# Patient Record
Sex: Male | Born: 1940 | Race: White | Hispanic: No
Health system: Southern US, Community
[De-identification: ages and names within clinical notes are randomized; demographics above are authoritative.]

## PROBLEM LIST (undated history)

## (undated) DIAGNOSIS — I10 Essential (primary) hypertension: Secondary | ICD-10-CM

## (undated) DIAGNOSIS — G610 Guillain-Barre syndrome: Secondary | ICD-10-CM

## (undated) DIAGNOSIS — F419 Anxiety disorder, unspecified: Secondary | ICD-10-CM

## (undated) DIAGNOSIS — Z9911 Dependence on respirator [ventilator] status: Secondary | ICD-10-CM

## (undated) DIAGNOSIS — F319 Bipolar disorder, unspecified: Secondary | ICD-10-CM

## (undated) DIAGNOSIS — I4891 Unspecified atrial fibrillation: Secondary | ICD-10-CM

## (undated) DIAGNOSIS — R532 Functional quadriplegia: Secondary | ICD-10-CM

---

## 2014-11-19 ENCOUNTER — Inpatient Hospital Stay (HOSPITAL_COMMUNITY): Payer: Medicare Other

## 2014-11-19 ENCOUNTER — Emergency Department (HOSPITAL_COMMUNITY): Payer: Medicare Other

## 2014-11-19 ENCOUNTER — Inpatient Hospital Stay (HOSPITAL_COMMUNITY)
Admission: EM | Admit: 2014-11-19 | Discharge: 2014-11-23 | DRG: 871 | Disposition: A | Discharge status: Other Acute Inpatient Hospital | Payer: Medicare Other | Attending: Pulmonary Disease | Admitting: Pulmonary Disease

## 2014-11-19 ENCOUNTER — Encounter (HOSPITAL_COMMUNITY): Payer: Self-pay

## 2014-11-19 DIAGNOSIS — K529 Noninfective gastroenteritis and colitis, unspecified: Secondary | ICD-10-CM | POA: Diagnosis present

## 2014-11-19 DIAGNOSIS — Z930 Tracheostomy status: Secondary | ICD-10-CM | POA: Diagnosis not present

## 2014-11-19 DIAGNOSIS — J9611 Chronic respiratory failure with hypoxia: Secondary | ICD-10-CM

## 2014-11-19 DIAGNOSIS — Z931 Gastrostomy status: Secondary | ICD-10-CM

## 2014-11-19 DIAGNOSIS — F319 Bipolar disorder, unspecified: Secondary | ICD-10-CM | POA: Diagnosis present

## 2014-11-19 DIAGNOSIS — R609 Edema, unspecified: Secondary | ICD-10-CM | POA: Diagnosis not present

## 2014-11-19 DIAGNOSIS — D649 Anemia, unspecified: Secondary | ICD-10-CM | POA: Diagnosis present

## 2014-11-19 DIAGNOSIS — N179 Acute kidney failure, unspecified: Secondary | ICD-10-CM

## 2014-11-19 DIAGNOSIS — R532 Functional quadriplegia: Secondary | ICD-10-CM | POA: Diagnosis present

## 2014-11-19 DIAGNOSIS — L89150 Pressure ulcer of sacral region, unstageable: Secondary | ICD-10-CM | POA: Diagnosis not present

## 2014-11-19 DIAGNOSIS — K567 Ileus, unspecified: Secondary | ICD-10-CM

## 2014-11-19 DIAGNOSIS — I4891 Unspecified atrial fibrillation: Secondary | ICD-10-CM | POA: Diagnosis present

## 2014-11-19 DIAGNOSIS — Z885 Allergy status to narcotic agent status: Secondary | ICD-10-CM

## 2014-11-19 DIAGNOSIS — I10 Essential (primary) hypertension: Secondary | ICD-10-CM | POA: Diagnosis not present

## 2014-11-19 DIAGNOSIS — E872 Acidosis: Secondary | ICD-10-CM | POA: Diagnosis present

## 2014-11-19 DIAGNOSIS — Z9911 Dependence on respirator [ventilator] status: Secondary | ICD-10-CM | POA: Diagnosis not present

## 2014-11-19 DIAGNOSIS — R6521 Severe sepsis with septic shock: Secondary | ICD-10-CM | POA: Diagnosis present

## 2014-11-19 DIAGNOSIS — B964 Proteus (mirabilis) (morganii) as the cause of diseases classified elsewhere: Secondary | ICD-10-CM

## 2014-11-19 DIAGNOSIS — N390 Urinary tract infection, site not specified: Secondary | ICD-10-CM | POA: Diagnosis present

## 2014-11-19 DIAGNOSIS — Z452 Encounter for adjustment and management of vascular access device: Secondary | ICD-10-CM

## 2014-11-19 DIAGNOSIS — E860 Dehydration: Secondary | ICD-10-CM | POA: Diagnosis not present

## 2014-11-19 DIAGNOSIS — J9610 Chronic respiratory failure, unspecified whether with hypoxia or hypercapnia: Secondary | ICD-10-CM

## 2014-11-19 DIAGNOSIS — E440 Moderate protein-calorie malnutrition: Secondary | ICD-10-CM | POA: Diagnosis not present

## 2014-11-19 DIAGNOSIS — K5660 Unspecified intestinal obstruction: Secondary | ICD-10-CM | POA: Diagnosis present

## 2014-11-19 DIAGNOSIS — Z7901 Long term (current) use of anticoagulants: Secondary | ICD-10-CM | POA: Diagnosis not present

## 2014-11-19 DIAGNOSIS — F419 Anxiety disorder, unspecified: Secondary | ICD-10-CM | POA: Diagnosis present

## 2014-11-19 DIAGNOSIS — J150 Pneumonia due to Klebsiella pneumoniae: Secondary | ICD-10-CM | POA: Diagnosis present

## 2014-11-19 DIAGNOSIS — A419 Sepsis, unspecified organism: Secondary | ICD-10-CM | POA: Diagnosis present

## 2014-11-19 DIAGNOSIS — Z6832 Body mass index (BMI) 32.0-32.9, adult: Secondary | ICD-10-CM

## 2014-11-19 DIAGNOSIS — K219 Gastro-esophageal reflux disease without esophagitis: Secondary | ICD-10-CM | POA: Diagnosis not present

## 2014-11-19 DIAGNOSIS — L8990 Pressure ulcer of unspecified site, unspecified stage: Secondary | ICD-10-CM | POA: Insufficient documentation

## 2014-11-19 DIAGNOSIS — E876 Hypokalemia: Secondary | ICD-10-CM | POA: Diagnosis present

## 2014-11-19 HISTORY — DX: Bipolar disorder, unspecified: F31.9

## 2014-11-19 HISTORY — DX: Unspecified atrial fibrillation: I48.91

## 2014-11-19 HISTORY — DX: Functional quadriplegia: R53.2

## 2014-11-19 HISTORY — DX: Anxiety disorder, unspecified: F41.9

## 2014-11-19 HISTORY — DX: Essential (primary) hypertension: I10

## 2014-11-19 HISTORY — DX: Dependence on respirator (ventilator) status: Z99.11

## 2014-11-19 HISTORY — DX: Guillain-Barre syndrome: G61.0

## 2014-11-19 LAB — GLUCOSE, CAPILLARY
GLUCOSE-CAPILLARY: 108 mg/dL — AB (ref 65–99)
GLUCOSE-CAPILLARY: 116 mg/dL — AB (ref 65–99)
Glucose-Capillary: 125 mg/dL — ABNORMAL HIGH (ref 65–99)

## 2014-11-19 LAB — CBC WITH DIFFERENTIAL/PLATELET
BASOS PCT: 0 % (ref 0–1)
Basophils Absolute: 0 10*3/uL (ref 0.0–0.1)
EOS PCT: 0 % (ref 0–5)
Eosinophils Absolute: 0 10*3/uL (ref 0.0–0.7)
HCT: 26.6 % — ABNORMAL LOW (ref 39.0–52.0)
Hemoglobin: 9 g/dL — ABNORMAL LOW (ref 13.0–17.0)
Lymphocytes Relative: 3 % — ABNORMAL LOW (ref 12–46)
Lymphs Abs: 1 10*3/uL (ref 0.7–4.0)
MCH: 30.1 pg (ref 26.0–34.0)
MCHC: 33.8 g/dL (ref 30.0–36.0)
MCV: 89 fL (ref 78.0–100.0)
MONO ABS: 1.4 10*3/uL — AB (ref 0.1–1.0)
MONOS PCT: 4 % (ref 3–12)
NEUTROS ABS: 31.8 10*3/uL — AB (ref 1.7–7.7)
NEUTROS PCT: 93 % — AB (ref 43–77)
Platelets: 593 10*3/uL — ABNORMAL HIGH (ref 150–400)
RBC: 2.99 MIL/uL — AB (ref 4.22–5.81)
RDW: 16.3 % — AB (ref 11.5–15.5)
WBC: 34.2 10*3/uL — ABNORMAL HIGH (ref 4.0–10.5)

## 2014-11-19 LAB — URINALYSIS, ROUTINE W REFLEX MICROSCOPIC
Glucose, UA: NEGATIVE mg/dL
Ketones, ur: NEGATIVE mg/dL
NITRITE: NEGATIVE
Protein, ur: 100 mg/dL — AB
Specific Gravity, Urine: 1.02 (ref 1.005–1.030)
Urobilinogen, UA: 0.2 mg/dL (ref 0.0–1.0)
pH: 8.5 — ABNORMAL HIGH (ref 5.0–8.0)

## 2014-11-19 LAB — COMPREHENSIVE METABOLIC PANEL
ALT: 25 U/L (ref 17–63)
AST: 39 U/L (ref 15–41)
Albumin: 2 g/dL — ABNORMAL LOW (ref 3.5–5.0)
Alkaline Phosphatase: 174 U/L — ABNORMAL HIGH (ref 38–126)
Anion gap: 16 — ABNORMAL HIGH (ref 5–15)
BILIRUBIN TOTAL: 0.9 mg/dL (ref 0.3–1.2)
BUN: 55 mg/dL — ABNORMAL HIGH (ref 6–20)
CHLORIDE: 89 mmol/L — AB (ref 101–111)
CO2: 26 mmol/L (ref 22–32)
Calcium: 9.2 mg/dL (ref 8.9–10.3)
Creatinine, Ser: 1.85 mg/dL — ABNORMAL HIGH (ref 0.61–1.24)
GFR, EST AFRICAN AMERICAN: 40 mL/min — AB (ref >60)
GFR, EST NON AFRICAN AMERICAN: 34 mL/min — AB (ref >60)
Glucose, Bld: 121 mg/dL — ABNORMAL HIGH (ref 65–99)
POTASSIUM: 4.7 mmol/L (ref 3.5–5.1)
Sodium: 131 mmol/L — ABNORMAL LOW (ref 135–145)
TOTAL PROTEIN: 6 g/dL — AB (ref 6.5–8.1)

## 2014-11-19 LAB — I-STAT CG4 LACTIC ACID, ED: LACTIC ACID, VENOUS: 2.87 mmol/L — AB (ref 0.5–2.0)

## 2014-11-19 LAB — I-STAT ARTERIAL BLOOD GAS, ED
Acid-Base Excess: 2 mmol/L (ref 0.0–2.0)
Bicarbonate: 26.1 mEq/L — ABNORMAL HIGH (ref 20.0–24.0)
O2 Saturation: 100 %
PO2 ART: 170 mmHg — AB (ref 80.0–100.0)
TCO2: 27 mmol/L (ref 0–100)
pCO2 arterial: 37.8 mmHg (ref 35.0–45.0)
pH, Arterial: 7.447 (ref 7.350–7.450)

## 2014-11-19 LAB — URINE MICROSCOPIC-ADD ON

## 2014-11-19 LAB — CARBOXYHEMOGLOBIN
Carboxyhemoglobin: 1.2 % (ref 0.5–1.5)
METHEMOGLOBIN: 0.9 % (ref 0.0–1.5)
O2 Saturation: 68.7 %
Total hemoglobin: 10.2 g/dL — ABNORMAL LOW (ref 13.5–18.0)

## 2014-11-19 LAB — PROCALCITONIN: PROCALCITONIN: 41.28 ng/mL

## 2014-11-19 LAB — PROTIME-INR
INR: 3.46 — ABNORMAL HIGH (ref 0.00–1.49)
Prothrombin Time: 34.1 seconds — ABNORMAL HIGH (ref 11.6–15.2)

## 2014-11-19 LAB — MRSA PCR SCREENING: MRSA by PCR: NEGATIVE

## 2014-11-19 LAB — I-STAT CHEM 8, ED
BUN: 47 mg/dL — ABNORMAL HIGH (ref 6–20)
CHLORIDE: 94 mmol/L — AB (ref 101–111)
Calcium, Ion: 1.05 mmol/L — ABNORMAL LOW (ref 1.13–1.30)
Creatinine, Ser: 2 mg/dL — ABNORMAL HIGH (ref 0.61–1.24)
GLUCOSE: 126 mg/dL — AB (ref 65–99)
HCT: 28 % — ABNORMAL LOW (ref 39.0–52.0)
HEMOGLOBIN: 9.5 g/dL — AB (ref 13.0–17.0)
Potassium: 3.8 mmol/L (ref 3.5–5.1)
Sodium: 131 mmol/L — ABNORMAL LOW (ref 135–145)
TCO2: 22 mmol/L (ref 0–100)

## 2014-11-19 LAB — CORTISOL: CORTISOL PLASMA: 65 ug/dL

## 2014-11-19 LAB — PHOSPHORUS: PHOSPHORUS: 5.1 mg/dL — AB (ref 2.5–4.6)

## 2014-11-19 LAB — APTT: aPTT: 51 seconds — ABNORMAL HIGH (ref 24–37)

## 2014-11-19 LAB — LIPASE, BLOOD: LIPASE: 11 U/L — AB (ref 22–51)

## 2014-11-19 LAB — MAGNESIUM: MAGNESIUM: 2 mg/dL (ref 1.7–2.4)

## 2014-11-19 LAB — CG4 I-STAT (LACTIC ACID): Lactic Acid, Venous: 1.67 mmol/L (ref 0.5–2.0)

## 2014-11-19 MED ORDER — SODIUM CHLORIDE 0.9 % IV BOLUS (SEPSIS)
1000.0000 mL | INTRAVENOUS | Status: AC
  Administered 2014-11-19 (×2): 1000 mL via INTRAVENOUS

## 2014-11-19 MED ORDER — VANCOMYCIN HCL 10 G IV SOLR
2000.0000 mg | Freq: Once | INTRAVENOUS | Status: AC
  Administered 2014-11-19: 2000 mg via INTRAVENOUS
  Filled 2014-11-19: qty 2000

## 2014-11-19 MED ORDER — FAMOTIDINE IN NACL 20-0.9 MG/50ML-% IV SOLN
20.0000 mg | Freq: Two times a day (BID) | INTRAVENOUS | Status: DC
  Administered 2014-11-19 – 2014-11-23 (×8): 20 mg via INTRAVENOUS
  Filled 2014-11-19 (×10): qty 50

## 2014-11-19 MED ORDER — METRONIDAZOLE IN NACL 5-0.79 MG/ML-% IV SOLN
500.0000 mg | Freq: Four times a day (QID) | INTRAVENOUS | Status: DC
  Administered 2014-11-19 – 2014-11-20 (×3): 500 mg via INTRAVENOUS
  Filled 2014-11-19 (×7): qty 100

## 2014-11-19 MED ORDER — INSULIN ASPART 100 UNIT/ML ~~LOC~~ SOLN
1.0000 [IU] | SUBCUTANEOUS | Status: DC
  Administered 2014-11-19 – 2014-11-20 (×2): 1 [IU] via SUBCUTANEOUS

## 2014-11-19 MED ORDER — CETYLPYRIDINIUM CHLORIDE 0.05 % MT LIQD
7.0000 mL | Freq: Two times a day (BID) | OROMUCOSAL | Status: DC
  Administered 2014-11-20 (×2): 7 mL via OROMUCOSAL

## 2014-11-19 MED ORDER — PHENYLEPHRINE HCL 10 MG/ML IJ SOLN
20.0000 ug/min | INTRAMUSCULAR | Status: DC
  Administered 2014-11-19: 20 ug/min via INTRAVENOUS
  Filled 2014-11-19: qty 1

## 2014-11-19 MED ORDER — FENTANYL CITRATE (PF) 100 MCG/2ML IJ SOLN
50.0000 ug | INTRAMUSCULAR | Status: DC | PRN
  Administered 2014-11-20 – 2014-11-21 (×3): 50 ug via INTRAVENOUS
  Filled 2014-11-19 (×3): qty 2

## 2014-11-19 MED ORDER — SODIUM CHLORIDE 0.9 % IV BOLUS (SEPSIS)
500.0000 mL | INTRAVENOUS | Status: AC
  Administered 2014-11-19: 500 mL via INTRAVENOUS

## 2014-11-19 MED ORDER — PHENYLEPHRINE HCL 10 MG/ML IJ SOLN
30.0000 ug/min | INTRAVENOUS | Status: DC
  Filled 2014-11-19: qty 1

## 2014-11-19 MED ORDER — ACETAMINOPHEN 650 MG RE SUPP
650.0000 mg | Freq: Once | RECTAL | Status: AC
  Administered 2014-11-19: 650 mg via RECTAL
  Filled 2014-11-19: qty 1

## 2014-11-19 MED ORDER — FENTANYL CITRATE (PF) 100 MCG/2ML IJ SOLN: 50.0000 ug | INTRAMUSCULAR | Status: DC | PRN

## 2014-11-19 MED ORDER — VANCOMYCIN 50 MG/ML ORAL SOLUTION
500.0000 mg | Freq: Four times a day (QID) | ORAL | Status: DC
  Filled 2014-11-19 (×4): qty 10

## 2014-11-19 MED ORDER — PIPERACILLIN-TAZOBACTAM 3.375 G IVPB 30 MIN
3.3750 g | Freq: Once | INTRAVENOUS | Status: AC
  Administered 2014-11-19: 3.375 g via INTRAVENOUS
  Filled 2014-11-19: qty 50

## 2014-11-19 MED ORDER — PIPERACILLIN-TAZOBACTAM 3.375 G IVPB
3.3750 g | Freq: Three times a day (TID) | INTRAVENOUS | Status: DC
  Administered 2014-11-19 – 2014-11-23 (×11): 3.375 g via INTRAVENOUS
  Filled 2014-11-19 (×14): qty 50

## 2014-11-19 MED ORDER — DEXTROSE 5 % IV SOLN
0.0000 ug/min | INTRAVENOUS | Status: DC
  Administered 2014-11-19: 100 ug/min via INTRAVENOUS
  Administered 2014-11-19 – 2014-11-20 (×2): 80 ug/min via INTRAVENOUS
  Administered 2014-11-21: 30 ug/min via INTRAVENOUS
  Filled 2014-11-19 (×6): qty 4

## 2014-11-19 MED ORDER — VASOPRESSIN 20 UNIT/ML IV SOLN
0.0300 [IU]/min | INTRAVENOUS | Status: DC
  Administered 2014-11-19: 0.03 [IU]/min via INTRAVENOUS
  Filled 2014-11-19 (×3): qty 2

## 2014-11-19 MED ORDER — SODIUM CHLORIDE 0.9 % IV SOLN
INTRAVENOUS | Status: DC
  Administered 2014-11-19: 18:00:00 via INTRAVENOUS
  Administered 2014-11-20: 125 mL/h via INTRAVENOUS

## 2014-11-19 MED ORDER — VANCOMYCIN HCL 10 G IV SOLR
1250.0000 mg | INTRAVENOUS | Status: DC
  Administered 2014-11-20 – 2014-11-21 (×2): 1250 mg via INTRAVENOUS
  Filled 2014-11-19 (×3): qty 1250

## 2014-11-19 MED ORDER — CHLORHEXIDINE GLUCONATE 0.12 % MT SOLN
15.0000 mL | Freq: Two times a day (BID) | OROMUCOSAL | Status: DC
  Administered 2014-11-19 – 2014-11-20 (×3): 15 mL via OROMUCOSAL
  Filled 2014-11-19 (×3): qty 15

## 2014-11-19 NOTE — ED Notes (Signed)
Critical care at bedside to place central line 

## 2014-11-19 NOTE — ED Notes (Signed)
Critical care at bedside  

## 2014-11-19 NOTE — ED Provider Notes (Signed)
CSN: [id number]     Arrival date & time 11/19/14  1322 History   First MD Initiated Contact with Patient 11/19/14 1326     Chief Complaint  Patient presents with  . Tachycardia  . Code Sepsis     (Consider location/radiation/quality/duration/timing/severity/associated sxs/prior Treatment) HPI Patient presents from another hospital due to staff concerns thereof patient's decompensation over the past 3 days. Report was initially obtained from EMS providers, as the patient is nonverbal, altered, level V caveat. Eventually I discussed this case with a physician at that hospital. She reports that over the past several days the patient has had vomiting, diarrhea, decreased interactivity, generalized weakness. The patient has a history of Guillain-Barr, quadriplegia, is ventilator dependent, has feeding tube in place.  Past Medical History  Diagnosis Date  . Ventilator dependence   . Atrial fibrillation   . Guillain Barr syndrome   . Hypertension   . Anxiety   . Bipolar disorder   . Functional quadriplegia    History reviewed. No pertinent past surgical history. History reviewed. No pertinent family history. History  Substance Use Topics  . Smoking status: Unknown If Ever Smoked  . Smokeless tobacco: Not on file  . Alcohol Use: Not on file    Review of Systems  Unable to perform ROS: Unstable vital signs   nonverbal     Allergies  Morphine and related  Home Medications   Prior to Admission medications   Not on File   BP 99/70 mmHg  Pulse 153  Temp(Src) 102.1 F (38.9 C) (Rectal)  Resp 40  SpO2 93% Physical Exam  Constitutional: He appears listless. He appears distressed.  Pale, diaphoretic elderly male in extremis, minimally responsive  HENT:  Head: Normocephalic and atraumatic.  Eyes: Conjunctivae and EOM are normal.  Patient does not track visually  Cardiovascular: Regular rhythm.  Tachycardia present.   Pulmonary/Chest: Accessory muscle usage present. No  stridor. Tachypnea noted. He is in respiratory distress.    Abdominal:  Large distended abdomen, mid left lower quadrant feeding tube in place, clean, dry, intact  Genitourinary:     Musculoskeletal: He exhibits no edema.  Neurological: He appears listless.  Patient moves minimally, does not follow commands, is noninteractive, with only occasional facial movement, visual activity  Skin: Skin is warm. He is diaphoretic.  Psychiatric: Cognition and memory are impaired.  Nursing note and vitals reviewed.   ED Course  Procedures (including critical care time) Labs Review Labs Reviewed  CULTURE, BLOOD (ROUTINE X 2)  CULTURE, BLOOD (ROUTINE X 2)  URINE CULTURE  CBC WITH DIFFERENTIAL/PLATELET  COMPREHENSIVE METABOLIC PANEL  URINALYSIS, ROUTINE W REFLEX MICROSCOPIC (NOT [facility])  I-STAT CG4 LACTIC ACID, ED    Imaging Review No results found.   EKG Interpretation   Date/Time:  Thursday November 19 2014 13:30:07 EDT Ventricular Rate:  148 PR Interval:    QRS Duration: 151 QT Interval:  359 QTC Calculation: 563 R Axis:   -141 Text Interpretation:  Extreme tachycardia with wide complex, no further  rhythm analysis attempted Atrial fibrillation with rapid ventricular  response Non-specific intra-ventricular conduction delay Abnormal ekg  Confirmed by Gerhard MunchLOCKWOOD, Meiya Wisler  MD [phone]([id number]) on 11/19/2014 2:05:50 PM     Cardiac 155, irregular abnormal Blood pressure is 68/35, abnormal After the initial assessment, discussion with EMS, physicians at the facility, patient required placement of intraosseous line for resuscitation.   I /O Catheter insertion Performed by: Gerhard MunchLOCKWOOD, Amoree Newlon  Consent: implied 2/2 critical condition. Risks and benefits: risks, benefits and alternatives  were discussed Time out: Immediately prior to procedure a "time out" was called to verify the correct patient, procedure, equipment, support staff and site/side marked as required.  Preparation: Patient was prepped  and draped in the usual sterile fashion.  L anterior tibia  Gauge: L adult  Normal blood return and flush without difficulty Patient tolerance: Patient tolerated the procedure well with no immediate complications.   On re-exam following initiation of IVF, BP slightly better, SBP 85    Update: Patient is hypotensive, in spite of ongoing fluid resuscitation.  Update: Hypertension persists, tachycardia persists. I discussed the patient's case with our critical care team, will assist with management, admission. Patient has received empiric Zosyn, as he received vancomycin at his facility prior to transfer.   Phenylephrine added to IVF.   I contacted the patient's daughter on the telephone. We discussed the patient's condition, goals of care. Patient has recently been improving, prior to this current decompensation, and family states that with that in mind and want ongoing resuscitative efforts, short of excessive CPR.  On re-check the patient remains hypotensive, tachycardic.  MDM   Patient presents in extremis from another facility. Patient is essentially nonverbal, though he has intermittent head nodding, linking. Patient is febrile, tachycardic, hypotensive, and with change in cognition is concern for septic track. Patient received fluid resuscitation, broad-spectrum antibiotics per protocol. IV access was obtained via intraosseous line. I discussed this case with critical care several times, as well as the patient's family members. Patient required admission for further evaluation and management.  CRITICAL CARE Performed by: Gerhard Munch Total critical care time: 40 Critical care time was exclusive of separately billable procedures and treating other patients. Critical care was necessary to treat or prevent imminent or life-threatening deterioration. Critical care was time spent personally by me on the following activities: development of treatment plan with patient  and/or surrogate as well as nursing, discussions with consultants, evaluation of patient's response to treatment, examination of patient, obtaining history from patient or surrogate, ordering and performing treatments and interventions, ordering and review of laboratory studies, ordering and review of radiographic studies, pulse oximetry and re-evaluation of patient's condition.     Gerhard Munch, MD 11/19/14 [phone]

## 2014-11-19 NOTE — ED Notes (Signed)
Pt placed in gown and in bed. Pt monitored by pulse ox, bp cuff, and 12-lead. 

## 2014-11-19 NOTE — Progress Notes (Signed)
Notified Heather RT that the vent was messaging "check tubing".

## 2014-11-19 NOTE — H&P (Signed)
PULMONARY / CRITICAL CARE MEDICINE   Name: Chase Bright MRN: [id number] DOB: August 02, 1940    ADMISSION DATE:  11/19/2014  REFERRING MD :  EDP   CHIEF COMPLAINT:  Sepsis   INITIAL PRESENTATION:  74yo male [facility] pt with hx AFib, Guillian Barre with chronic trach and vent dependence presented 6/2 with nausea, vomiting, diarrhea, lethargy.  Found to be in AFib with RVR, septic shock with SBP 70's and PCCM called to admit.   STUDIES:    SIGNIFICANT EVENTS: CT abd 6/2>>>    HISTORY OF PRESENT ILLNESS:  74yo male [facility] pt with hx AFib, Guillian Barre with chronic trach and vent dependence presented 6/2 with nausea, vomiting, diarrhea, lethargy.  Initially [facility] was called to transport patient r/t constipation, vomiting but found pt to be pale, diaphoretic and in Afib with RVR.  In ED remained in AFib with RVR, septic shock with SBP 70's and PCCM called to admit.    PAST MEDICAL HISTORY :   has a past medical history of Ventilator dependence; Atrial fibrillation; Guillain Barr syndrome; Hypertension; Anxiety; Bipolar disorder; and Functional quadriplegia.  has no past surgical history on file. Prior to Admission medications   Not on File   Allergies  Allergen Reactions  . Morphine And Related     Unknown     FAMILY HISTORY:  has no family status information on file.  SOCIAL HISTORY:    REVIEW OF SYSTEMS:  Unable, as per HPI obtained from records.   VITAL SIGNS: Temp:  [102.1 F (38.9 C)] 102.1 F (38.9 C) (06/02 1341) Pulse Rate:  [43-153] 136 (06/02 1435) Resp:  [26-46] 34 (06/02 1505) BP: (59-99)/(29-70) 84/30 mmHg (06/02 1505) SpO2:  [93 %-100 %] 99 % (06/02 1500) FiO2 (%):  [70 %] 70 % (06/02 1334) Weight:  [97.523 kg (215 lb)] 97.523 kg (215 lb) (06/02 1442) HEMODYNAMICS:   VENTILATOR SETTINGS: Vent Mode:  [-] PRVC FiO2 (%):  [70 %] 70 % Set Rate:  [50 bmp] 50 bmp Vt Set:  [500 mL] 500 mL PEEP:  [5 cmH20] 5 cmH20 Plateau Pressure:  [23 cmH20] 23  cmH20 INTAKE / OUTPUT: No intake or output data in the 24 hours ending 11/19/14 1515  PHYSICAL EXAMINATION: General:  Acute on chronically ill appearing vent dependent male,  Neuro:  Awake, tries to verbalize, no movement of upper or lower ext  HEENT:  # 6 cuffed trach, unremarkable Cardiovascular:  Tachy regular irreg  Lungs:  Diffuse rhonchi  Abdomen:  Distended, soft hypoactive appears tender to palp of lower quad  Musculoskeletal:  Minimal tone.  Skin:  Anasarca, + sacral ulcer   LABS:  CBC  Recent Labs Lab 11/19/14 1410  WBC 34.2*  HGB 9.0*  HCT 26.6*  PLT 593*   Coag's No results for input(s): APTT, INR in the last 168 hours. BMET  Recent Labs Lab 11/19/14 1410  NA 131*  K 4.7  CL 89*  CO2 26  BUN 55*  CREATININE 1.85*  GLUCOSE 121*   Electrolytes  Recent Labs Lab 11/19/14 1410  CALCIUM 9.2   Sepsis Markers  Recent Labs Lab 11/19/14 1418  LATICACIDVEN 2.87*   ABG No results for input(s): PHART, PCO2ART, PO2ART in the last 168 hours. Liver Enzymes  Recent Labs Lab 11/19/14 1410  AST 39  ALT 25  ALKPHOS 174*  BILITOT 0.9  ALBUMIN 2.0*   Cardiac Enzymes No results for input(s): TROPONINI, PROBNP in the last 168 hours. Glucose No results for input(s): GLUCAP in the last  168 hours.  Imaging Dg Chest Port 1 View  11/19/2014   CLINICAL DATA:  Hypoxia.  Guillain-Barre syndrome.  Sepsis.  EXAM: PORTABLE CHEST - 1 VIEW  COMPARISON:  None.  FINDINGS: Tracheostomy catheter tip is 4.7 cm above the carina. No pneumothorax. There is patchy predominantly interstitial infiltrate throughout much of the right mid and lower lung zones. There is mild left base atelectasis. Heart size and pulmonary vascularity are normal. No adenopathy. No bone lesions.  IMPRESSION: There is patchy predominantly interstitial infiltrate throughout much of the right mid and lower lung zones. Question aspiration given the appearance. Mild left base atelectasis. Tracheostomy as  described. No pneumothorax.   Electronically Signed   By: Bretta Bang III M.D.   On: 11/19/2014 14:19     ASSESSMENT / PLAN:  PULMONARY Chronic trach>>> Chronic vent dependence - r/t functional quadriplegia/ guillian barre  ?aspiration PNA vs HCAP P:   Maintain vent support  F/u ABG  F/u CXR  Empiric abx as below  PRN BD   CARDIOVASCULAR Septic shock  Afib with RVR  R IJ TLC 6/2>>> P:  Trend troponin  Volume resuscitation  Chronic coumadin (hold for now) F/u lactate  EKG  30 ml/kg fluid challenge Neo for pressors Ck cortisol   RENAL AKI Dehydration  Lactic acidosis in setting of sepsis P:   Volume resuscitation as above  NS overnight, eval am chem 6/3 and convert to D51/2 NS or LR depending on chem Strict I&O Renal dose meds  GASTROINTESTINAL ?bowel obstruction vs toxic megacolon in setting of Cdiff Nausea/vomiting  Severe protein calorie malnutrition  GERD P:   Repeat flat plat of abd  See ID section  NPO Get CT abd/pelvis   HEMATOLOGIC A:   Chronic anticoagulation (on coumadin) Normocytic normochromic anemia w/out current evidence of bleeding  P:  Trend CBC Ck INR Hold coumadin for now; change to IC heparin given acute illness   INFECTIOUS A:   Diarrhea r/o CDIFF HCAP vs aspiration  P:   BCx2 6/2>>> UC 6/2>>> Sputum 6/2>>> vanc 6/2>>> Zosyn 6/2>>> Flagyl 6/2>>> vanc (PEG) 6/2>>>  ENDOCRINE A:  No acute  P:   Trend cbg  NEUROLOGIC A:   Functional quadriplegia s/p Guillian Barre which failed to respond to plasmaphoresis  P:   RASS goal: -1 PAD protocol  Supportive care   FAMILY  - Updates: pending   - Inter-disciplinary family meet or Palliative Care meeting due by:  6/9  TODAY'S SUMMARY:  74 year old functional quad/trach and PEG dependent after GB. Presents from [facility] w/ prob cdiff and either SBO or toxic mega-colon, +/- HCAP vs aspiration.  Family states limited code. F/u assessment AFTER 30 ml/kg fluid  challenge: remains hypotensive w/ SBP in 70s. Will start sepsis protocol and provide supportive care   Simonne Martinet ACNP-BC [facility] Pulmonary/Critical Care Pager # [phone] OR # [phone] if no answer  Attending note:  74 year old male who developed GBS then subsequently became a functional quad with trach/peg.  Patient developed some diarrhea.  A CT in [facility] revealed SBO and patient was [facility] to [facility] ED for evaluation.  In the ED patient dropped his SBP and lactic acid came back elevated.  PCCM was called to admit.  Patient is barely responsive.  Will place TLC for pressors, IVF as ordered, follow CVP, vanc/zosyn IV, PO vanc and IV flagyl.  C. Diff sent.  Will f/u on CT, if SBO then will need surgery to evaluate him.  Will admit  to the ICU.  Spoke with family and code status clarified with pressors only, no CPR or cardioversion.  The patient is critically ill with multiple organ systems failure and requires high complexity decision making for assessment and support, frequent evaluation and titration of therapies, application of advanced monitoring technologies and extensive interpretation of multiple databases.   Critical Care Time devoted to patient care services described in this note is  45  Minutes. This time reflects time of care of this signee Dr Koren BoundWesam Yacoub. This critical care time does not reflect procedure time, or teaching time or supervisory time of PA/NP/Med student/Med Resident etc but could involve care discussion time.  Alyson ReedyWesam G. Yacoub, M.D. [facility] Pulmonary/Critical Care Medicine. Pager: [phone]. After hours pager: [phone].

## 2014-11-19 NOTE — Consult Note (Signed)
Reason for Consult:  PSBO vs ileus Referring Physician: [name] is an 74 y.o. male.  HPI: Chase Bright lives in [place]. He developed a viral illness in February with subsequent progressive weakness. He was admitted to a hospital in [place] and diagnosed with [place]. He underwent tracheostomy and PEG tube placement and was maintained on the ventilator. He was transferred to another hospital, I believe in [place]. He did not progress with weaning and was then transferred to [facility]. After initially improving slightly there, he developed some vomiting a couple days ago. He had some diarrhea and hypotension today and was transferred to [facility] and admitted by the critical care service. He underwent CT scan of the abdomen and pelvis today which shows some dilated loops of small bowel but contrast passes through to the distal colon. No evidence of bowel ischemia or perforation. No significant enteritis or colitis. His daughter is present at the bedside and helps with history.  Past Medical History  Diagnosis Date  . Ventilator dependence   . Atrial fibrillation   . Guillain Barr syndrome   . Hypertension   . Anxiety   . Bipolar disorder   . Functional quadriplegia     History reviewed. No pertinent past surgical history.  History reviewed. No pertinent family history.  Social History:  has no tobacco, alcohol, and drug history on file.  Allergies:  Allergies  Allergen Reactions  . Morphine And Related     Unknown     Medications:  Scheduled: . famotidine (PEPCID) IV  20 mg Intravenous Q12H  . insulin aspart  1-3 Units Subcutaneous 6 times per day  . metronidazole  500 mg Intravenous 4 times per day  . piperacillin-tazobactam (ZOSYN)  IV  3.375 g Intravenous 3 times per day  . [START ON 11/20/2014] vancomycin  1,250 mg Intravenous Q24H  . vancomycin  2,000 mg Intravenous Once   Continuous: . sodium chloride 125  mL/hr at 11/19/14 1743  . phenylephrine (NEO-SYNEPHRINE) Adult infusion 100 mcg/min (11/19/14 1756)  . vasopressin (PITRESSIN) infusion - *FOR SHOCK* 0.03 Units/min (11/19/14 1743)   QIH:KVQQVZDG (SUBLIMAZE) injection, fentaNYL (SUBLIMAZE) injection  Results for orders placed or performed during the hospital encounter of 11/19/14 (from the past 48 hour(s))  CBC WITH DIFFERENTIAL     Status: Abnormal   Collection Time: 11/19/14  2:10 PM  Result Value Ref Range   WBC 34.2 (H) 4.0 - 10.5 K/uL    Comment: WHITE COUNT CONFIRMED ON SMEAR   RBC 2.99 (L) 4.22 - 5.81 MIL/uL   Hemoglobin 9.0 (L) 13.0 - 17.0 g/dL   HCT 26.6 (L) 39.0 - 52.0 %   MCV 89.0 78.0 - 100.0 fL   MCH 30.1 26.0 - 34.0 pg   MCHC 33.8 30.0 - 36.0 g/dL   RDW 16.3 (H) 11.5 - 15.5 %   Platelets 593 (H) 150 - 400 K/uL    Comment: PLATELET COUNT CONFIRMED BY SMEAR REPEATED TO VERIFY    Neutrophils Relative % 93 (H) 43 - 77 %   Lymphocytes Relative 3 (L) 12 - 46 %   Monocytes Relative 4 3 - 12 %   Eosinophils Relative 0 0 - 5 %   Basophils Relative 0 0 - 1 %   Neutro Abs 31.8 (H) 1.7 - 7.7 K/uL   Lymphs Abs 1.0 0.7 - 4.0 K/uL   Monocytes Absolute 1.4 (H) 0.1 - 1.0 K/uL   Eosinophils Absolute 0.0 0.0 - 0.7 K/uL  Basophils Absolute 0.0 0.0 - 0.1 K/uL   RBC Morphology POLYCHROMASIA PRESENT    WBC Morphology MILD LEFT SHIFT (1-5% METAS, OCC MYELO, OCC BANDS)     Comment: TOXIC GRANULATION VACUOLATED NEUTROPHILS   Comprehensive metabolic panel     Status: Abnormal   Collection Time: 11/19/14  2:10 PM  Result Value Ref Range   Sodium 131 (L) 135 - 145 mmol/L   Potassium 4.7 3.5 - 5.1 mmol/L   Chloride 89 (L) 101 - 111 mmol/L   CO2 26 22 - 32 mmol/L   Glucose, Bld 121 (H) 65 - 99 mg/dL   BUN 55 (H) 6 - 20 mg/dL   Creatinine, Ser 1.85 (H) 0.61 - 1.24 mg/dL   Calcium 9.2 8.9 - 10.3 mg/dL   Total Protein 6.0 (L) 6.5 - 8.1 g/dL   Albumin 2.0 (L) 3.5 - 5.0 g/dL   AST 39 15 - 41 U/L   ALT 25 17 - 63 U/L   Alkaline  Phosphatase 174 (H) 38 - 126 U/L   Total Bilirubin 0.9 0.3 - 1.2 mg/dL   GFR calc non Af Amer 34 (L) >60 mL/min   GFR calc Af Amer 40 (L) >60 mL/min    Comment: (NOTE) The eGFR has been calculated using the CKD EPI equation. This calculation has not been validated in all clinical situations. eGFR's persistently <60 mL/min signify possible Chronic Kidney Disease.    Anion gap 16 (H) 5 - 15  Magnesium     Status: None   Collection Time: 11/19/14  2:10 PM  Result Value Ref Range   Magnesium 2.0 1.7 - 2.4 mg/dL  Phosphorus     Status: Abnormal   Collection Time: 11/19/14  2:10 PM  Result Value Ref Range   Phosphorus 5.1 (H) 2.5 - 4.6 mg/dL  Lipase, blood     Status: Abnormal   Collection Time: 11/19/14  2:10 PM  Result Value Ref Range   Lipase 11 (L) 22 - 51 U/L  APTT     Status: Abnormal   Collection Time: 11/19/14  2:10 PM  Result Value Ref Range   aPTT 51 (H) 24 - 37 seconds    Comment:        IF BASELINE aPTT IS ELEVATED, SUGGEST PATIENT RISK ASSESSMENT BE USED TO DETERMINE APPROPRIATE ANTICOAGULANT THERAPY.   Protime-INR     Status: Abnormal   Collection Time: 11/19/14  2:10 PM  Result Value Ref Range   Prothrombin Time 34.1 (H) 11.6 - 15.2 seconds   INR 3.46 (H) 0.00 - 1.49  Procalcitonin - Baseline     Status: None   Collection Time: 11/19/14  2:10 PM  Result Value Ref Range   Procalcitonin 41.28 ng/mL    Comment:        Interpretation: PCT >= 10 ng/mL: Important systemic inflammatory response, almost exclusively due to severe bacterial sepsis or septic shock. (NOTE)         ICU PCT Algorithm               Non ICU PCT Algorithm    ----------------------------     ------------------------------         PCT < 0.25 ng/mL                 PCT < 0.1 ng/mL     Stopping of antibiotics            Stopping of antibiotics       strongly encouraged.  strongly encouraged.    ----------------------------     ------------------------------       PCT level  decrease by               PCT < 0.25 ng/mL       >= 80% from peak PCT       OR PCT 0.25 - 0.5 ng/mL          Stopping of antibiotics                                             encouraged.     Stopping of antibiotics           encouraged.    ----------------------------     ------------------------------       PCT level decrease by              PCT >= 0.25 ng/mL       < 80% from peak PCT        AND PCT >= 0.5 ng/mL             Continuing antibiotics                                              encouraged.       Continuing antibiotics            encouraged.    ----------------------------     ------------------------------     PCT level increase compared          PCT > 0.5 ng/mL         with peak PCT AND          PCT >= 0.5 ng/mL             Escalation of antibiotics                                          strongly encouraged.      Escalation of antibiotics        strongly encouraged.   I-Stat CG4 Lactic Acid, ED  (not at [facility])     Status: Abnormal   Collection Time: 11/19/14  2:18 PM  Result Value Ref Range   Lactic Acid, Venous 2.87 (HH) 0.5 - 2.0 mmol/L   Comment NOTIFIED PHYSICIAN   Urinalysis, Routine w reflex microscopic     Status: Abnormal   Collection Time: 11/19/14  3:21 PM  Result Value Ref Range   Color, Urine AMBER (A) YELLOW    Comment: BIOCHEMICALS MAY BE AFFECTED BY COLOR   APPearance CLOUDY (A) CLEAR   Specific Gravity, Urine 1.020 1.005 - 1.030   pH 8.5 (H) 5.0 - 8.0   Glucose, UA NEGATIVE NEGATIVE mg/dL   Hgb urine dipstick LARGE (A) NEGATIVE   Bilirubin Urine SMALL (A) NEGATIVE   Ketones, ur NEGATIVE NEGATIVE mg/dL   Protein, ur 100 (A) NEGATIVE mg/dL   Urobilinogen, UA 0.2 0.0 - 1.0 mg/dL   Nitrite NEGATIVE NEGATIVE   Leukocytes, UA LARGE (A) NEGATIVE  Urine microscopic-add on     Status: Abnormal   Collection Time: 11/19/14  3:21 PM  Result Value Ref Range  Squamous Epithelial / LPF RARE RARE   WBC, UA 7-10 <3 WBC/hpf   RBC / HPF 7-10 <3 RBC/hpf    Bacteria, UA MANY (A) RARE   Casts HYALINE CASTS (A) NEGATIVE   Crystals CA OXALATE CRYSTALS (A) NEGATIVE   Urine-Other RARE YEAST   Cortisol     Status: None   Collection Time: 11/19/14  4:20 PM  Result Value Ref Range   Cortisol, Plasma 65.0 ug/dL    Comment: RESULTS CONFIRMED BY MANUAL DILUTION (NOTE) AM    6.7 - 22.6 ug/dL PM   <10.0       ug/dL   I-Stat arterial blood gas, ED     Status: Abnormal   Collection Time: 11/19/14  4:39 PM  Result Value Ref Range   pH, Arterial 7.447 7.350 - 7.450   pCO2 arterial 37.8 35.0 - 45.0 mmHg   pO2, Arterial 170.0 (H) 80.0 - 100.0 mmHg   Bicarbonate 26.1 (H) 20.0 - 24.0 mEq/L   TCO2 27 0 - 100 mmol/L   O2 Saturation 100.0 %   Acid-Base Excess 2.0 0.0 - 2.0 mmol/L   Collection site RADIAL, ALLEN'S TEST ACCEPTABLE    Drawn by RT    Sample type ARTERIAL   Glucose, capillary     Status: Abnormal   Collection Time: 11/19/14  4:39 PM  Result Value Ref Range   Glucose-Capillary 108 (H) 65 - 99 mg/dL  I-stat chem 8, ed     Status: Abnormal   Collection Time: 11/19/14  4:49 PM  Result Value Ref Range   Sodium 131 (L) 135 - 145 mmol/L   Potassium 3.8 3.5 - 5.1 mmol/L   Chloride 94 (L) 101 - 111 mmol/L   BUN 47 (H) 6 - 20 mg/dL   Creatinine, Ser 2.00 (H) 0.61 - 1.24 mg/dL   Glucose, Bld 126 (H) 65 - 99 mg/dL   Calcium, Ion 1.05 (L) 1.13 - 1.30 mmol/L   TCO2 22 0 - 100 mmol/L   Hemoglobin 9.5 (L) 13.0 - 17.0 g/dL   HCT 28.0 (L) 39.0 - 52.0 %  CG4 I-STAT (Lactic acid)     Status: None   Collection Time: 11/19/14  4:59 PM  Result Value Ref Range   Lactic Acid, Venous 1.67 0.5 - 2.0 mmol/L  Glucose, capillary     Status: Abnormal   Collection Time: 11/19/14  5:28 PM  Result Value Ref Range   Glucose-Capillary 116 (H) 65 - 99 mg/dL    Ct Abdomen Pelvis Wo Contrast  11/19/2014   CLINICAL DATA:  Small bowel obstruction.  EXAM: CT ABDOMEN AND PELVIS WITHOUT CONTRAST  TECHNIQUE: Multidetector CT imaging of the abdomen and pelvis was  performed following the standard protocol without IV contrast.  COMPARISON:  None.  FINDINGS: There is airspace consolidation in both lower lobes right greater than left.  Lower chest:  Hepatobiliary: No focal liver abnormality identified. The lateral segment of left lobe and caudate lobe of liver appears slightly enlarged with respect to the right hepatic lobe. The contour the liver is slightly irregular. Gallbladder normal. No biliary dilatation.  Pancreas: Normal appearance of the pancreas.  Spleen: The spleen is unremarkable.  Adrenals/Urinary Tract: The adrenal glands are both normal. Bilateral renal cysts are noted. These are incompletely characterized without IV contrast. Bilateral renal calculi noted. The largest is in the inferior pole of right kidney measuring 7 mm, image 49 of series 201. Urinary bladder is obscured by beam hardening artifact from right hip arthroplasty device.  Stomach/Bowel: The patient has a gastrostomy tube. Increase caliber of the small bowel loops measuring up to 3.6 cm. There is enteric contrast material within the small bowel loops and within the colon up to the level of the sigmoid colon. The colon has a normal caliber.  Vascular/Lymphatic: Calcified atherosclerotic disease involves the abdominal aorta. No aneurysm. No enlarged retroperitoneal or mesenteric adenopathy. No enlarged pelvic or inguinal lymph nodes.  Reproductive: Prostate gland appears enlarged.  Other: No free air. There is no significant free fluid or abnormal fluid collections.  Musculoskeletal: Previous right hip arthroplasty. There is degenerative disc disease noted within the lumbar spine. This is most advanced at the L5-S1 level.  IMPRESSION: 1. Increase caliber of the small bowel loops compatible with either partial small bowel obstruction or small bowel ileus. The enteric contrast material has passed through the small-bowel and into the distal colon. 2. Aortic atherosclerosis 3. Prostate gland enlargement  4. 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 5. Nephrolithiasis.   Electronically Signed   By: Kerby Moors M.D.   On: 11/19/2014 17:23   Portable Chest Xray  11/19/2014   CLINICAL DATA:  Central line placement.  EXAM: PORTABLE CHEST - 1 VIEW  COMPARISON:  Same day.  FINDINGS: Stable cardiomediastinal silhouette. Tracheostomy is unchanged and in grossly good position. Interval placement of right internal jugular catheter line with distal tip overlying expected position of the SVC. No pneumothorax or significant pleural effusion is noted. Minimal left basilar subsegmental atelectasis is noted. Stable opacities are noted in the right lung concerning for pneumonia or edema. Bony thorax is intact.  IMPRESSION: Stable right lung opacity concerning for pneumonia or edema. Minimal left basilar subsegmental atelectasis is noted. Interval placement of right internal jugular catheter line with distal tip overlying expected position of the SVC.   Electronically Signed   By: Marijo Conception, M.D.   On: 11/19/2014 16:10   Dg Chest Port 1 View  11/19/2014   CLINICAL DATA:  Hypoxia.  Guillain-Barre syndrome.  Sepsis.  EXAM: PORTABLE CHEST - 1 VIEW  COMPARISON:  None.  FINDINGS: Tracheostomy catheter tip is 4.7 cm above the carina. No pneumothorax. There is patchy predominantly interstitial infiltrate throughout much of the right mid and lower lung zones. There is mild left base atelectasis. Heart size and pulmonary vascularity are normal. No adenopathy. No bone lesions.  IMPRESSION: There is patchy predominantly interstitial infiltrate throughout much of the right mid and lower lung zones. Question aspiration given the appearance. Mild left base atelectasis. Tracheostomy as described. No pneumothorax.   Electronically Signed   By: Lowella Grip III M.D.   On: 11/19/2014 14:19    Review of Systems  Unable to perform ROS: intubated   Blood pressure 72/46, pulse 111, temperature 102.1 F (38.9  C), temperature source Rectal, resp. rate 19, weight 97.523 kg (215 lb), SpO2 100 %. Physical Exam  Constitutional: He appears well-developed. No distress.  HENT:  Head: Normocephalic.  Oral mucous membranes somewhat dry  Eyes: Pupils are equal, round, and reactive to light. Right eye exhibits no discharge. Left eye exhibits no discharge.  Cannot assess extraocular muscles as he is unresponsive  Neck: Neck supple.  Tracheostomy in place, ventilated  Cardiovascular: Normal rate and normal heart sounds.   Edema left upper extremity  Respiratory: No respiratory distress. He has no wheezes.  Ventilated, breath sounds somewhat distant but clear bilaterally  GI: Soft. He exhibits distension. There is no tenderness. There is no rebound and no  guarding.  Abdomen soft with mild distention, there is no tenderness, PEG tube in place which is to suction at this time with gastric output  Musculoskeletal: He exhibits edema.  Left upper extremity edema as above, PRAFO bilateral lower extremity  Neurological:  Unresponsive, does not move upper or lower extremities  Skin:  Cool  Psychiatric:  Cannot assess    Assessment/Plan: PSBO vs ileus - no evidence of bowel compromise or inflammation on CT. Contrast is very entered his distal colon. Agree with IV fluid resuscitation, bowel rest, and G-tube to suction. Further sepsis workup and C. difficile test pending per CCM. He is on broad-spectrum antibiotics. No need for acute surgical intervention. We will follow closely with you. I spoke with his daughter and son-in-law at the bedside.  Helyn Schwan E 11/19/2014, 6:58 PM

## 2014-11-19 NOTE — Progress Notes (Signed)
ANTIBIOTIC CONSULT NOTE - INITIAL  Pharmacy Consult for zosyn Indication: sepsis  Allergies  Allergen Reactions  . Morphine And Related     Unknown     Patient Measurements:    Vital Signs: Temp: 102.1 F (38.9 C) (06/02 1341) Temp Source: Rectal (06/02 1341) BP: 99/70 mmHg (06/02 1342) Pulse Rate: 153 (06/02 1342) Intake/Output from previous day:   Intake/Output from this shift:    Labs: No results for input(s): WBC, HGB, PLT, LABCREA, CREATININE in the last 72 hours. CrCl cannot be calculated (Unknown ideal weight.). No results for input(s): VANCOTROUGH, VANCOPEAK, VANCORANDOM, GENTTROUGH, GENTPEAK, GENTRANDOM, TOBRATROUGH, TOBRAPEAK, TOBRARND, AMIKACINPEAK, AMIKACINTROU, AMIKACIN in the last 72 hours.   Microbiology: No results found for this or any previous visit (from the past 720 hour(s)).  Medical History: Past Medical History  Diagnosis Date  . Ventilator dependence   . Atrial fibrillation   . Guillain Barr syndrome   . Hypertension   . Anxiety   . Bipolar disorder   . Functional quadriplegia     Assessment: 73 yom to ED for constipation. Found to be in afib w/ RVR on admit, HR 150-170. Pt on chronic ventilator due to Guillian Barre Syndrome. Pharmacy consulted to dose vanc/zosyn for septic shock/asp pna vs. HCAP? Also on flagyl IV/vanc po for bowel obstruction vs. Toxic megacolon in setting of cdiff. SCr 1.85, CrCl~45-50  6/2 zosyn>>  6/2 vanc>>  6/2 flagyl>>  6/2 vanc (PEG)>>   6/2 BCx2>>  6/2 TA>>  6/2 UC>>  Goal of Therapy:  Eradication of infection Vancomycin Trough Goal 15-20  Plan:  Zosyn 3.375g IV x1 (30min inf); then zosyn 3.375g IV q8h Vanc 2g x1; then vanc 1250mg  IV q24h Flagyl 500mg  IV q6h Vanc per tube 500mg  q6h F/u clinical progress, renal funct, c/s, abx plan  Babs BertinHaley Chinedu Agustin, PharmD Clinical Pharmacist - Resident Pager [phone] 11/19/2014 2:04 PM

## 2014-11-19 NOTE — ED Notes (Signed)
[facility]- pt coming from [facility]. Called out for transport for constipation. Pt found to be in a-fib RVR on [facility] arrival. HR between 150-170. Pt on chronic ventilator due to guillian barre syndrome. Pt alert to painful stimuli. Foley in place, infiltrated IV with D5 in place, removed on arrival.

## 2014-11-19 NOTE — Procedures (Signed)
Central Venous Catheter Insertion Procedure Note Micheal Likensugustus Washer [id number] 11/26/1940  Procedure: Insertion of Central Venous Catheter Indications: Assessment of intravascular volume, Drug and/or fluid administration and Frequent blood sampling  Procedure Details Consent: Risks of procedure as well as the alternatives and risks of each were explained to the (patient/caregiver).  Consent for procedure obtained. Time Out: Verified patient identification, verified procedure, site/side was marked, verified correct patient position, special equipment/implants available, medications/allergies/relevent history reviewed, required imaging and test results available.  Performed  Maximum sterile technique was used including antiseptics, cap, gloves, gown, hand hygiene, mask and sheet. Skin prep: Chlorhexidine; local anesthetic administered A antimicrobial bonded/coated triple lumen catheter was placed in the right internal jugular vein using the Seldinger technique.  Evaluation Blood flow good Complications: No apparent complications Patient did tolerate procedure well. Chest X-ray ordered to verify placement.  CXR: pending.  U/S used in placement.  YACOUB,WESAM 11/19/2014, 4:35 PM

## 2014-11-19 NOTE — Progress Notes (Addendum)
PCCM INTERVAL PROGRESS NOTE  Discussion with family. They tell me that GBS is a recent diagnosis 07/2014 after having the flu. He was hospitalized in [place] and showed little progress. They were told he would never recover. He was sent to [facility] [place] where he has had some improvement. He has not been able to wean at all, but has progressed to the point where he has been able to use in line speaking valve. Updated family on current condition. Also CT results noted to be partial small bowel obstruction vs ileus.   Plan: -NPO -PEG to continuous suction -Surgical consultation requested -Hold PO meds including Vanc for now   Chase Bright, [facility]NP-BC [facility] Pager [phone] or [phone]  11/19/2014 6:34 PM

## 2014-11-20 ENCOUNTER — Inpatient Hospital Stay (HOSPITAL_COMMUNITY): Payer: Medicare Other

## 2014-11-20 DIAGNOSIS — A419 Sepsis, unspecified organism: Principal | ICD-10-CM

## 2014-11-20 DIAGNOSIS — N179 Acute kidney failure, unspecified: Secondary | ICD-10-CM

## 2014-11-20 DIAGNOSIS — R6521 Severe sepsis with septic shock: Secondary | ICD-10-CM

## 2014-11-20 LAB — PROTIME-INR
INR: 4.54 — ABNORMAL HIGH (ref 0.00–1.49)
PROTHROMBIN TIME: 41.8 s — AB (ref 11.6–15.2)

## 2014-11-20 LAB — CBC
HCT: 29.2 % — ABNORMAL LOW (ref 39.0–52.0)
HEMOGLOBIN: 9.4 g/dL — AB (ref 13.0–17.0)
MCH: 28.8 pg (ref 26.0–34.0)
MCHC: 32.2 g/dL (ref 30.0–36.0)
MCV: 89.6 fL (ref 78.0–100.0)
PLATELETS: 407 10*3/uL — AB (ref 150–400)
RBC: 3.26 MIL/uL — ABNORMAL LOW (ref 4.22–5.81)
RDW: 15.9 % — AB (ref 11.5–15.5)
WBC: 18.7 10*3/uL — ABNORMAL HIGH (ref 4.0–10.5)

## 2014-11-20 LAB — BASIC METABOLIC PANEL
Anion gap: 14 (ref 5–15)
BUN: 51 mg/dL — ABNORMAL HIGH (ref 6–20)
CO2: 25 mmol/L (ref 22–32)
CREATININE: 1.49 mg/dL — AB (ref 0.61–1.24)
Calcium: 8.2 mg/dL — ABNORMAL LOW (ref 8.9–10.3)
Chloride: 92 mmol/L — ABNORMAL LOW (ref 101–111)
GFR calc non Af Amer: 45 mL/min — ABNORMAL LOW (ref >60)
GFR, EST AFRICAN AMERICAN: 52 mL/min — AB (ref >60)
Glucose, Bld: 127 mg/dL — ABNORMAL HIGH (ref 65–99)
Potassium: 3.1 mmol/L — ABNORMAL LOW (ref 3.5–5.1)
SODIUM: 131 mmol/L — AB (ref 135–145)

## 2014-11-20 LAB — POCT I-STAT 3, ART BLOOD GAS (G3+)
Acid-base deficit: 1 mmol/L (ref 0.0–2.0)
BICARBONATE: 23.4 meq/L (ref 20.0–24.0)
O2 Saturation: 98 %
PH ART: 7.441 (ref 7.350–7.450)
Patient temperature: 97.2
TCO2: 24 mmol/L (ref 0–100)
pCO2 arterial: 34.1 mmHg — ABNORMAL LOW (ref 35.0–45.0)
pO2, Arterial: 92 mmHg (ref 80.0–100.0)

## 2014-11-20 LAB — LEGIONELLA ANTIGEN, URINE

## 2014-11-20 LAB — PHOSPHORUS: PHOSPHORUS: 5.2 mg/dL — AB (ref 2.5–4.6)

## 2014-11-20 LAB — STREP PNEUMONIAE URINARY ANTIGEN: Strep Pneumo Urinary Antigen: NEGATIVE

## 2014-11-20 LAB — GLUCOSE, CAPILLARY
Glucose-Capillary: 114 mg/dL — ABNORMAL HIGH (ref 65–99)
Glucose-Capillary: 114 mg/dL — ABNORMAL HIGH (ref 65–99)
Glucose-Capillary: 135 mg/dL — ABNORMAL HIGH (ref 65–99)
Glucose-Capillary: 90 mg/dL (ref 65–99)

## 2014-11-20 LAB — PROCALCITONIN: Procalcitonin: 27.65 ng/mL

## 2014-11-20 LAB — MAGNESIUM: MAGNESIUM: 2.1 mg/dL (ref 1.7–2.4)

## 2014-11-20 LAB — CLOSTRIDIUM DIFFICILE BY PCR: Toxigenic C. Difficile by PCR: NEGATIVE

## 2014-11-20 MED ORDER — POTASSIUM CHLORIDE 10 MEQ/50ML IV SOLN
10.0000 meq | INTRAVENOUS | Status: AC
  Administered 2014-11-20 (×4): 10 meq via INTRAVENOUS
  Filled 2014-11-20 (×3): qty 50

## 2014-11-20 MED ORDER — DEXTROSE-NACL 5-0.9 % IV SOLN
INTRAVENOUS | Status: DC
  Administered 2014-11-20 – 2014-11-21 (×3): via INTRAVENOUS
  Administered 2014-11-22: 100 mL/h via INTRAVENOUS

## 2014-11-20 MED ORDER — COLLAGENASE 250 UNIT/GM EX OINT
TOPICAL_OINTMENT | Freq: Every day | CUTANEOUS | Status: DC
  Administered 2014-11-21 – 2014-11-22 (×2): via TOPICAL
  Administered 2014-11-23: 1 via TOPICAL
  Filled 2014-11-20: qty 30

## 2014-11-20 MED ORDER — SODIUM CHLORIDE 0.9 % IV BOLUS (SEPSIS)
1000.0000 mL | Freq: Once | INTRAVENOUS | Status: AC
  Administered 2014-11-20: 1000 mL via INTRAVENOUS

## 2014-11-20 NOTE — Progress Notes (Signed)
RT called to room due to pt desaturation. RT suctioned and lavaged, received copious, yellow and tan, thick secretions. Took pt several minutes to recover and return to >92% SpO2. RT will continue to wean fio2 as tolerated.

## 2014-11-20 NOTE — Progress Notes (Signed)
ANTICOAGULATION CONSULT NOTE - Initial Consult  Pharmacy Consult for Heparin Indication: atrial fibrillation  Allergies  Allergen Reactions  . Morphine And Related     Unknown     Patient Measurements: Weight: 217 lb 2.5 oz (98.5 kg)  Vital Signs: Temp: 98.4 F (36.9 C) (06/03 0810) Temp Source: Oral (06/03 0810) BP: 116/70 mmHg (06/03 0925) Pulse Rate: 96 (06/03 0925)  Labs:  Recent Labs  11/19/14 1410 11/19/14 1649 11/20/14 0521  HGB 9.0* 9.5* 9.4*  HCT 26.6* 28.0* 29.2*  PLT 593*  --  407*  APTT 51*  --   --   LABPROT 34.1*  --  41.8*  INR 3.46*  --  4.54*  CREATININE 1.85* 2.00* 1.49*    CrCl cannot be calculated (Unknown ideal weight.).   Medical History: Past Medical History  Diagnosis Date  . Ventilator dependence   . Atrial fibrillation   . Guillain Barr syndrome   . Hypertension   . Anxiety   . Bipolar disorder   . Functional quadriplegia     Medications:  Prescriptions prior to admission  Medication Sig Dispense Refill Last Dose  . amiodarone (PACERONE) 200 MG tablet 200 mg by PEG Tube route daily.   10/29/2014  . aspirin EC 81 MG tablet 81 mg by PEG Tube route daily.   11/19/2014 at Unknown time  . atorvastatin (LIPITOR) 20 MG tablet 20 mg by PEG Tube route daily.   11/18/2014 at Unknown time  . budesonide (PULMICORT) 0.5 MG/2ML nebulizer solution Take 0.5 mg by nebulization 2 (two) times daily.   11/19/2014 at Unknown time  . buPROPion (WELLBUTRIN) 100 MG tablet 100 mg by PEG Tube route 3 (three) times daily.   11/19/2014 at Unknown time  . carvedilol (COREG) 6.25 MG tablet 6.25 mg by PEG Tube route 2 (two) times daily with a meal.   11/19/2014 at 800  . cetaphil (CETAPHIL) lotion Apply 1 application topically daily.   11/17/2014  . chlorhexidine (PERIDEX) 0.12 % solution Use as directed 15 mLs in the mouth or throat 2 (two) times daily.   11/19/2014 at Unknown time  . dextrose 5 % solution Inject 75 mLs into the vein continuous.   11/19/2014 at Unknown  time  . ergocalciferol (DRISDOL) 8000 UNIT/ML drops Place 8,000 Units into feeding tube daily.   11/19/2014 at Unknown time  . furosemide (LASIX) 40 MG tablet 40 mg by PEG Tube route 2 (two) times daily.   11/18/2014 at Unknown time  . gabapentin (NEURONTIN) 250 MG/5ML solution Place 200 mg into feeding tube 3 (three) times daily.   11/19/2014 at Unknown time  . ipratropium-albuterol (DUONEB) 0.5-2.5 (3) MG/3ML SOLN Take 3 mLs by nebulization every 6 (six) hours.   11/19/2014 at Unknown time  . miconazole (MICOTIN) 2 % cream Apply 1 application topically every 8 (eight) hours.   11/19/2014 at Unknown time  . mineral oil-hydrophilic petrolatum (AQUAPHOR) ointment Apply 1 application topically daily.   11/19/2014 at Unknown time  . Multiple Vitamins-Minerals (MULTIVITAMIN & MINERAL) LIQD 15 mLs by PEG Tube route daily.   11/19/2014 at Unknown time  . nystatin cream (MYCOSTATIN) Apply 1 application topically 2 (two) times daily.   11/18/2014 at Unknown time  . omeprazole (PRILOSEC) 20 MG capsule Place 20 mg into feeding tube daily.   11/18/2014 at Unknown time  . ondansetron (ZOFRAN) 4 MG/5ML solution Take 4 mg by mouth every 8 (eight) hours as needed for nausea or vomiting.   11/19/2014 at Unknown time  .  OVER THE COUNTER MEDICATION 74 mLs by PEG Tube route every 8 (eight) hours. Protein Modular Liquid for wound healing   11/19/2014 at Unknown time  . oxyCODONE (ROXICODONE) 5 MG/5ML solution Take 7.5 mg by mouth every 8 (eight) hours.   11/19/2014 at Unknown time  . valproate (DEPAKENE) 250 MG/5ML syrup Take 250 mg by mouth 2 (two) times daily.   11/08/2014  . valproic acid (DEPAKENE) 250 MG capsule 250 mg by PEG Tube route 2 (two) times daily.   11/06/2014  . VANCOMYCIN HCL PO 500 mg by PEG Tube route every 8 (eight) hours.   11/19/2014 at Unknown time  . warfarin (COUMADIN) 2 MG tablet 2 mg by PEG Tube route daily.   10/22/2014  . warfarin (COUMADIN) 2.5 MG tablet 2.5 mg by PEG Tube route daily.   11/18/2014 at Unknown time  .  warfarin (COUMADIN) 3 MG tablet 3 mg by PEG Tube route daily.   11/05/2014  . warfarin (COUMADIN) 5 MG tablet 5 mg by PEG Tube route daily.   10/29/2014    Assessment: 73 yom to ED from [facility] admitted w/ N/V/D and lethargy. Found to be in afib w/ RVR on admit, HR 150-170, septic shock. Pt on chronic ventilator due to Guillian Barre Syndrome.   PMH: vent dependence, afib on warfarin, Guillian Barre syn, HTN, anxiety, bipolar dx, functional quadriplegia.  INR 4.54  Goal of Therapy:  Heparin level 0.3-0.7 units/ml Monitor platelets by anticoagulation protocol: Yes   Plan:  -Initiate heparin when INR < 2 -Daily INR -Monitor for s/sx bleeding  Isaac Bliss, PharmD, BCPS Clinical Pharmacist Pager [phone] 11/20/2014 10:01 AM

## 2014-11-20 NOTE — Progress Notes (Signed)
Initial Nutrition Assessment  DOCUMENTATION CODES:  Non-severe (moderate) malnutrition in context of chronic illness  INTERVENTION:   When able to begin enteral nutrition, recommend Vital High Protein with goal rate of 55 ml/h to provide 1320 kcals, 116 gm protein, 1104 ml free water daily  NUTRITION DIAGNOSIS:  Inadequate oral intake related to inability to eat as evidenced by NPO status.  GOAL:  Provide needs based on ASPEN/SCCM guidelines  MONITOR:  Other (Comment) (Ability to start enteral nutrition; overall goals of care)  REASON FOR ASSESSMENT:  Ventilator    ASSESSMENT: 74yo male [facility] pt with hx AFib, Guillian Barre with chronic trach and vent dependence presented 6/2 with nausea, vomiting, diarrhea, lethargy.  PEG feedings are on hold due to acute GI issues, likely SBO per discussion with RN. Unsure of usual TF regimen. Nutrition-Focused physical exam completed. Findings are mild-moderate fat depletion and mild-moderate muscle depletion.   Patient is currently on vent/trach. MV: 11.7 L/min Temp (24hrs), Avg:98.6 F (37 C), Min:97.2 F (36.2 C), Max:102.1 F (38.9 C)  Propofol: none   Labs reviewed. Sodium and potassium are low. BUN, creatinine, phosphorus elevated.  Height:  Ht Readings from Last 1 Encounters:  11/20/14 5\' 4"  (1.626 m)    Weight:  Wt Readings from Last 1 Encounters:  11/20/14 217 lb 2.5 oz (98.5 kg)    Ideal Body Weight:  59.1 kg  Wt Readings from Last 10 Encounters:  11/20/14 217 lb 2.5 oz (98.5 kg)    BMI:  Body mass index is 37.26 kg/(m^2).  Estimated Nutritional Needs:  Kcal:  [id number]  Protein:  118 gm  Fluid:  2 L  Skin:  Wound (see comment) (stage 3 pressure ulcer to sacrum, DTI to bilateral heels)  Diet Order:  Diet NPO time specified  EDUCATION NEEDS:  No education needs identified at this time   Intake/Output Summary (Last 24 hours) at 11/20/14 1147 Last data filed at 11/20/14 1000  Gross per  24 hour  Intake 10671.87 ml  Output   1600 ml  Net 9071.87 ml    Last BM:  6/3   Joaquin CourtsKimberly Angelisa Winthrop, RD, LDN, CNSC Pager [phone] After Hours Pager [phone]

## 2014-11-20 NOTE — Progress Notes (Signed)
Subjective: On vent / trach not responsive   Objective: Vital signs in last 24 hours: Temp:  [97.2 F (36.2 C)-102.1 F (38.9 C)] 98.4 F (36.9 C) (06/03 0810) Pulse Rate:  [43-153] 92 (06/03 0715) Resp:  [15-46] 25 (06/03 0715) BP: (59-135)/(29-80) 99/54 mmHg (06/03 0715) SpO2:  [93 %-100 %] 99 % (06/03 0715) FiO2 (%):  [45 %-70 %] 45 % (06/03 0325) Weight:  [97.523 kg (215 lb)-98.5 kg (217 lb 2.5 oz)] 98.5 kg (217 lb 2.5 oz) (06/03 0500) Last BM Date: 11/20/14  Intake/Output from previous day: 06/02 0701 - 06/03 0700 In: 9310.5 [I.V.:8810.5; IV Piggyback:500] Out: 1400 [Urine:750; Drains:650] Intake/Output this shift:    abdomen with feeding tube in place soft NT ND no peritonitis   Lab Results:   Recent Labs  11/19/14 1410 11/19/14 1649 11/20/14 0521  WBC 34.2*  --  18.7*  HGB 9.0* 9.5* 9.4*  HCT 26.6* 28.0* 29.2*  PLT 593*  --  407*   BMET  Recent Labs  11/19/14 1410 11/19/14 1649 11/20/14 0521  NA 131* 131* 131*  K 4.7 3.8 3.1*  CL 89* 94* 92*  CO2 26  --  25  GLUCOSE 121* 126* 127*  BUN 55* 47* 51*  CREATININE 1.85* 2.00* 1.49*  CALCIUM 9.2  --  8.2*   PT/INR  Recent Labs  11/19/14 1410 11/20/14 0521  LABPROT 34.1* 41.8*  INR 3.46* 4.54*   ABG  Recent Labs  11/19/14 1639 11/20/14 0338  PHART 7.447 7.441  HCO3 26.1* 23.4    Studies/Results: Ct Abdomen Pelvis Wo Contrast  11/19/2014   CLINICAL DATA:  Small bowel obstruction.  EXAM: CT ABDOMEN AND PELVIS WITHOUT CONTRAST  TECHNIQUE: Multidetector CT imaging of the abdomen and pelvis was performed following the standard protocol without IV contrast.  COMPARISON:  None.  FINDINGS: There is airspace consolidation in both lower lobes right greater than left.  Lower chest:  Hepatobiliary: No focal liver abnormality identified. The lateral segment of left lobe and caudate lobe of liver appears slightly enlarged with respect to the right hepatic lobe. The contour the liver is slightly  irregular. Gallbladder normal. No biliary dilatation.  Pancreas: Normal appearance of the pancreas.  Spleen: The spleen is unremarkable.  Adrenals/Urinary Tract: The adrenal glands are both normal. Bilateral renal cysts are noted. These are incompletely characterized without IV contrast. Bilateral renal calculi noted. The largest is in the inferior pole of right kidney measuring 7 mm, image 49 of series 201. Urinary bladder is obscured by beam hardening artifact from right hip arthroplasty device.  Stomach/Bowel: The patient has a gastrostomy tube. Increase caliber of the small bowel loops measuring up to 3.6 cm. There is enteric contrast material within the small bowel loops and within the colon up to the level of the sigmoid colon. The colon has a normal caliber.  Vascular/Lymphatic: Calcified atherosclerotic disease involves the abdominal aorta. No aneurysm. No enlarged retroperitoneal or mesenteric adenopathy. No enlarged pelvic or inguinal lymph nodes.  Reproductive: Prostate gland appears enlarged.  Other: No free air. There is no significant free fluid or abnormal fluid collections.  Musculoskeletal: Previous right hip arthroplasty. There is degenerative disc disease noted within the lumbar spine. This is most advanced at the L5-S1 level.  IMPRESSION: 1. Increase caliber of the small bowel loops compatible with either partial small bowel obstruction or small bowel ileus. The enteric contrast material has passed through the small-bowel and into the distal colon. 2. Aortic atherosclerosis 3. Prostate gland enlargement 4. 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 5. Nephrolithiasis.   Electronically Signed   By: Signa Kell M.D.   On: 11/19/2014 17:23   Portable Chest Xray  11/20/2014   CLINICAL DATA:  Subsequent encounter for ventilator dependence.  EXAM: PORTABLE CHEST - 1 VIEW  COMPARISON:  11/19/2014.  FINDINGS: 10/1932 hrs. Low volume film with vascular congestion. Slight  improvement in right lung airspace disease. Bibasilar atelectasis persist. No substantial pleural effusion. The cardio pericardial silhouette is enlarged. Tracheostomy tube again noted. Right IJ central line tip projects over the proximal to mid SVC region.  IMPRESSION: Low volume film with vascular congestion and interval slight improvement in right lung airspace disease.   Electronically Signed   By: Kennith Center M.D.   On: 11/20/2014 07:11   Portable Chest Xray  11/19/2014   CLINICAL DATA:  Central line placement.  EXAM: PORTABLE CHEST - 1 VIEW  COMPARISON:  Same day.  FINDINGS: Stable cardiomediastinal silhouette. Tracheostomy is unchanged and in grossly good position. Interval placement of right internal jugular catheter line with distal tip overlying expected position of the SVC. No pneumothorax or significant pleural effusion is noted. Minimal left basilar subsegmental atelectasis is noted. Stable opacities are noted in the right lung concerning for pneumonia or edema. Bony thorax is intact.  IMPRESSION: Stable right lung opacity concerning for pneumonia or edema. Minimal left basilar subsegmental atelectasis is noted. Interval placement of right internal jugular catheter line with distal tip overlying expected position of the SVC.   Electronically Signed   By: Lupita Raider, M.D.   On: 11/19/2014 16:10   Dg Chest Port 1 View  11/19/2014   CLINICAL DATA:  Hypoxia.  Guillain-Barre syndrome.  Sepsis.  EXAM: PORTABLE CHEST - 1 VIEW  COMPARISON:  None.  FINDINGS: Tracheostomy catheter tip is 4.7 cm above the carina. No pneumothorax. There is patchy predominantly interstitial infiltrate throughout much of the right mid and lower lung zones. There is mild left base atelectasis. Heart size and pulmonary vascularity are normal. No adenopathy. No bone lesions.  IMPRESSION: There is patchy predominantly interstitial infiltrate throughout much of the right mid and lower lung zones. Question aspiration given the  appearance. Mild left base atelectasis. Tracheostomy as described. No pneumothorax.   Electronically Signed   By: Bretta Bang III M.D.   On: 11/19/2014 14:19    Anti-infectives: Anti-infectives    Start     Dose/Rate Route Frequency Ordered Stop   11/20/14 1600  vancomycin (VANCOCIN) 1,250 mg in sodium chloride 0.9 % 250 mL IVPB     1,250 mg 166.7 mL/hr over 90 Minutes Intravenous Every 24 hours 11/19/14 1553     11/19/14 2230  piperacillin-tazobactam (ZOSYN) IVPB 3.375 g     3.375 g 12.5 mL/hr over 240 Minutes Intravenous 3 times per day 11/19/14 1554     11/19/14 1800  metroNIDAZOLE (FLAGYL) IVPB 500 mg     500 mg 100 mL/hr over 60 Minutes Intravenous 4 times per day 11/19/14 1449     11/19/14 1800  vancomycin (VANCOCIN) 50 mg/mL oral solution 500 mg  Status:  Discontinued     500 mg Per Tube 4 times per day 11/19/14 1531 11/19/14 1852   11/19/14 1600  vancomycin (VANCOCIN) 2,000 mg in sodium chloride 0.9 % 500 mL IVPB     2,000 mg 250 mL/hr over 120 Minutes Intravenous  Once 11/19/14 1553 11/19/14 1945   11/19/14 1400  piperacillin-tazobactam (ZOSYN) IVPB 3.375 g  3.375 g 100 mL/hr over 30 Minutes Intravenous  Once 11/19/14 1353 11/19/14 1454      Assessment/Plan: Patient Active Problem List   Diagnosis Date Noted  . Septic shock 11/19/2014  no SBO at this point Benign abdomen Will sign off call if condition changes    LOS: 1 day    Mihaela Fajardo A. 11/20/2014

## 2014-11-20 NOTE — Progress Notes (Signed)
eLink Physician-Brief Progress Note Patient Name: Chase Bright DOB: 10/09/1940 MRN: [id number]   Date of Service  11/20/2014  HPI/Events of Note  Low CBGs  eICU Interventions  Change IVfs to D5NS @ 100     Intervention Category Intermediate Interventions: Other:  [name] V. 11/20/2014, 8:07 PM

## 2014-11-20 NOTE — Care Management Note (Signed)
Case Management Note  Patient Details  Name: Micheal Likensugustus Crill MRN: [id number] Date of Birth: 10/23/1940  Subjective/Objective:  74 y.o.M admitted from [facility] for Sepsis. Has been Vent Dependent since 07/2014 d/t Guillen-Barre.                    Action/Plan:Will follow for progress   Expected Discharge Date:                  Expected Discharge Plan:  Long Term Acute Care (LTAC) (admitted from [facility])  In-House Referral:  NA  Discharge planning Services  CM Consult  Post Acute Care Choice:    Choice offered to:     DME Arranged:    DME Agency:     HH Arranged:    HH Agency:     Status of Service:  In process, will continue to follow  Medicare Important Message Given:    Date Medicare IM Given:    Medicare IM give by:    Date Additional Medicare IM Given:    Additional Medicare Important Message give by:     If discussed at Long Length of Stay Meetings, dates discussed:    Additional Comments:  Yvone NeuCrutchfield, Thomson Herbers M, RN 11/20/2014, 9:23 AM

## 2014-11-20 NOTE — Consult Note (Addendum)
WOC wound consult note Reason for Consult: Consult requested for buttocks/sacrum wound.   Wound type: Unstageable wound 6X8cm; 50% yellow slough, 50% red and moist; appearance is consistent with a previous deep tissue injury which has evolved.  Loose peeling skin to wound edges Pressure Ulcer POA: Yes Drainage (amount, consistency, odor) Large pink yellow drainage, no odor Periwound: Patchy areas of partial thickness skin loss to lower bilat buttocks where skin is peeling and red moist wound beds are revealed. Dressing procedure/placement/frequency: Santyl ointment for chemical debridement of nonviable tissue to sacrum/buttock area.  Pt is on a Sport low airloss bed to reduce pressure. He is not very awake and is intubated, no family at bedside to discuss plan of care. Please re-consult if further assistance is needed.  Thank-you,  Cammie Mcgeeawn Mordche Hedglin MSN, RN, CWOCN, [place], CNS [phone]

## 2014-11-20 NOTE — H&P (Addendum)
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  Initially [facility] was called to transport patient r/t constipation, vomiting but found pt to be pale, diaphoretic and in Afib with RVR.  In ED remained in AFib with RVR, septic shock with SBP 70's and PCCM called to admit.    SIGNIFICANT EVENTS: CT abd 6/2>>>     SUBJECTIVE/OVERNIGHT/INTERVAL HX  11/20/14: On chronic vent. On neo 60 and vasopressin shock dose. Wathing TV. CCS does not think patient has SBO given fact contrast entered distal colon but have advised bowel rest and G tube suction and conservative Rx.  PCT 32 -> 27 with Rx. C Diff negative.  Urine looks concentrate/cloudy   VITAL SIGNS: Temp:  [97.2 F (36.2 C)-102.1 F (38.9 C)] 98.4 F (36.9 C) (06/03 0810) Pulse Rate:  [43-153] 89 (06/03 1000) Resp:  [15-46] 24 (06/03 1000) BP: (59-135)/(29-80) 102/73 mmHg (06/03 1000) SpO2:  [93 %-100 %] 98 % (06/03 1000) FiO2 (%):  [45 %-70 %] 45 % (06/03 0926) Weight:  [97.523 kg (215 lb)-98.5 kg (217 lb 2.5 oz)] 98.5 kg (217 lb 2.5 oz) (06/03 0500) HEMODYNAMICS: CVP:  [0 mmHg-10 mmHg] 7 mmHg VENTILATOR SETTINGS: Vent Mode:  [-] PRVC FiO2 (%):  [45 %-70 %] 45 % Set Rate:  [20 bmp] 20 bmp Vt Set:  [500 mL] 500 mL PEEP:  [5 cmH20] 5 cmH20 Plateau Pressure:  [14 cmH20-24 cmH20] 16 cmH20 INTAKE / OUTPUT:  Intake/Output Summary (Last 24 hours) at 11/20/14 1034 Last data filed at 11/20/14 1000  Gross per 24 hour  Intake 10664.37 ml  Output   1400 ml  Net 9264.37 ml    PHYSICAL EXAMINATION: General:  Acute on chronically ill appearing vent dependent male,  Neuro:  Awake, tries to verbalize, no movement of upper or lower ext   HEENT:  # 6 cuffed trach, unremarkable Cardiovascular:  Tachy regular irreg  Lungs:  Diffuse rhonchi  Abdomen:  Distended, soft hypoactive appears tender to palp of lower quad  Musculoskeletal:  Minimal tone.  Skin:  Anasarca, + sacral ulcer   LABS: PULMONARY  Recent Labs Lab 11/19/14 1639 11/19/14 1649 11/19/14 1900 11/20/14 0338  PHART 7.447  --   --  7.441  PCO2ART 37.8  --   --  34.1*  PO2ART 170.0*  --   --  92.0  HCO3 26.1*  --   --  23.4  TCO2 27 22  --  24  O2SAT 100.0  --  68.7 98.0    CBC  Recent Labs Lab 11/19/14 1410 11/19/14 1649 11/20/14 0521  HGB 9.0* 9.5* 9.4*  HCT 26.6* 28.0* 29.2*  WBC 34.2*  --  18.7*  PLT 593*  --  407*    COAGULATION  Recent Labs Lab 11/19/14 1410 11/20/14 0521  INR 3.46* 4.54*    CARDIAC  No results for input(s): TROPONINI in the last 168 hours. No results for input(s): PROBNP in the last 168 hours.   CHEMISTRY  Recent Labs Lab 11/19/14 1410 11/19/14 1649 11/20/14 0521  NA 131* 131* 131*  K 4.7 3.8 3.1*  CL 89* 94* 92*  CO2 26  --  25  GLUCOSE 121* 126* 127*  BUN 55* 47*  51*  CREATININE 1.85* 2.00* 1.49*  CALCIUM 9.2  --  8.2*  MG 2.0  --  2.1  PHOS 5.1*  --  5.2*   CrCl cannot be calculated (Unknown ideal weight.).   LIVER  Recent Labs Lab 11/19/14 1410 11/20/14 0521  AST 39  --   ALT 25  --   ALKPHOS 174*  --   BILITOT 0.9  --   PROT 6.0*  --   ALBUMIN 2.0*  --   INR 3.46* 4.54*     INFECTIOUS  Recent Labs Lab 11/19/14 1410 11/19/14 1418 11/19/14 1659 11/20/14 0521  LATICACIDVEN  --  2.87* 1.67  --   PROCALCITON 41.28  --   --  27.65     ENDOCRINE CBG (last 3)   Recent Labs  11/20/14 0021 11/20/14 0416 11/20/14 0804  GLUCAP 135* 114* 114*         IMAGING x48h  - image(s) personally visualized  -   highlighted in bold Ct Abdomen Pelvis Wo Contrast  11/19/2014   CLINICAL DATA:  Small bowel obstruction.  EXAM: CT ABDOMEN AND PELVIS WITHOUT CONTRAST  TECHNIQUE:  Multidetector CT imaging of the abdomen and pelvis was performed following the standard protocol without IV contrast.  COMPARISON:  None.  FINDINGS: There is airspace consolidation in both lower lobes right greater than left.  Lower chest:  Hepatobiliary: No focal liver abnormality identified. The lateral segment of left lobe and caudate lobe of liver appears slightly enlarged with respect to the right hepatic lobe. The contour the liver is slightly irregular. Gallbladder normal. No biliary dilatation.  Pancreas: Normal appearance of the pancreas.  Spleen: The spleen is unremarkable.  Adrenals/Urinary Tract: The adrenal glands are both normal. Bilateral renal cysts are noted. These are incompletely characterized without IV contrast. Bilateral renal calculi noted. The largest is in the inferior pole of right kidney measuring 7 mm, image 49 of series 201. Urinary bladder is obscured by beam hardening artifact from right hip arthroplasty device.  Stomach/Bowel: The patient has a gastrostomy tube. Increase caliber of the small bowel loops measuring up to 3.6 cm. There is enteric contrast material within the small bowel loops and within the colon up to the level of the sigmoid colon. The colon has a normal caliber.  Vascular/Lymphatic: Calcified atherosclerotic disease involves the abdominal aorta. No aneurysm. No enlarged retroperitoneal or mesenteric adenopathy. No enlarged pelvic or inguinal lymph nodes.  Reproductive: Prostate gland appears enlarged.  Other: No free air. There is no significant free fluid or abnormal fluid collections.  Musculoskeletal: Previous right hip arthroplasty. There is degenerative disc disease noted within the lumbar spine. This is most advanced at the L5-S1 level.  IMPRESSION: 1. Increase caliber of the small bowel loops compatible with either partial small bowel obstruction or small bowel ileus. The enteric contrast material has passed through the small-bowel and into the distal colon. 2.  Aortic atherosclerosis 3. Prostate gland enlargement 4. 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 5. Nephrolithiasis.   Electronically Signed   By: Signa Kell M.D.   On: 11/19/2014 17:23   Portable Chest Xray  11/20/2014   CLINICAL DATA:  Subsequent encounter for ventilator dependence.  EXAM: PORTABLE CHEST - 1 VIEW  COMPARISON:  11/19/2014.  FINDINGS: 10/1932 hrs. Low volume film with vascular congestion. Slight improvement in right lung airspace disease. Bibasilar atelectasis persist. No substantial pleural effusion. The cardio pericardial silhouette is enlarged. Tracheostomy tube again noted. Right IJ central line tip projects over  the proximal to mid SVC region.  IMPRESSION: Low volume film with vascular congestion and interval slight improvement in right lung airspace disease.   Electronically Signed   By: Kennith CenterEric  Mansell M.D.   On: 11/20/2014 07:11   Portable Chest Xray  11/19/2014   CLINICAL DATA:  Central line placement.  EXAM: PORTABLE CHEST - 1 VIEW  COMPARISON:  Same day.  FINDINGS: Stable cardiomediastinal silhouette. Tracheostomy is unchanged and in grossly good position. Interval placement of right internal jugular catheter line with distal tip overlying expected position of the SVC. No pneumothorax or significant pleural effusion is noted. Minimal left basilar subsegmental atelectasis is noted. Stable opacities are noted in the right lung concerning for pneumonia or edema. Bony thorax is intact.  IMPRESSION: Stable right lung opacity concerning for pneumonia or edema. Minimal left basilar subsegmental atelectasis is noted. Interval placement of right internal jugular catheter line with distal tip overlying expected position of the SVC.   Electronically Signed   By: Lupita RaiderJames  Green Jr, M.D.   On: 11/19/2014 16:10   Dg Chest Port 1 View  11/19/2014   CLINICAL DATA:  Hypoxia.  Guillain-Barre syndrome.  Sepsis.  EXAM: PORTABLE CHEST - 1 VIEW  COMPARISON:  None.  FINDINGS:  Tracheostomy catheter tip is 4.7 cm above the carina. No pneumothorax. There is patchy predominantly interstitial infiltrate throughout much of the right mid and lower lung zones. There is mild left base atelectasis. Heart size and pulmonary vascularity are normal. No adenopathy. No bone lesions.  IMPRESSION: There is patchy predominantly interstitial infiltrate throughout much of the right mid and lower lung zones. Question aspiration given the appearance. Mild left base atelectasis. Tracheostomy as described. No pneumothorax.   Electronically Signed   By: Bretta BangWilliam  Woodruff III M.D.   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 STill on neo. CVP 8. Cortisol 65  P:  Volume resuscitation  Chronic coumadin (hold for now) No role for hydrocort due to high coristol  RENAL AKI Dehydration  Lactic acidosis in setting of sepsis  - improving creat but urine looks cloudy / concentrated  P:   Saline at 125cc/h > give another 1L bolus Renal dose meds  GASTROINTESTINAL ?bowel obstruction vs toxic megacolon in setting of Cdiff Nausea/vomiting  Severe protein calorie malnutrition  GERD   - cCS rec conservative Rx  P:   G tube to suction NPO Keep K > 4  HEMATOLOGIC A:   Chronic anticoagulation (on coumadin) Normocytic normochromic anemia w/out current evidence of bleeding  P:  Trend CBC Ck INR Hold coumadin for now; change to IC heparin given acute illness   INFECTIOUS Results for orders placed or performed during the hospital encounter of 11/19/14  Blood Culture (routine x 2)     Status: None (Preliminary result)   Collection Time: 11/19/14  1:59 PM  Result Value Ref Range  Status   Specimen Description BLOOD RIGHT NECK  Final   Special Requests BOTTLES DRAWN AEROBIC AND ANAEROBIC 5CC  Final   Culture   Final           BLOOD CULTURE  RECEIVED NO GROWTH TO DATE CULTURE WILL BE HELD FOR 5 DAYS BEFORE ISSUING A FINAL NEGATIVE REPORT Performed at [facility]    Report Status PENDING  Incomplete  Blood Culture (routine x 2)     Status: None (Preliminary result)   Collection Time: 11/19/14  4:28 PM  Result Value Ref Range Status   Specimen Description BLOOD RIGHT ARM  Final   Special Requests BOTTLES DRAWN AEROBIC AND ANAEROBIC 5CC  Final   Culture   Final           BLOOD CULTURE RECEIVED NO GROWTH TO DATE CULTURE WILL BE HELD FOR 5 DAYS BEFORE ISSUING A FINAL NEGATIVE REPORT Performed at [facility]    Report Status PENDING  Incomplete  MRSA PCR Screening     Status: None   Collection Time: 11/19/14  5:27 PM  Result Value Ref Range Status   MRSA by PCR NEGATIVE NEGATIVE Final    Comment:        The GeneXpert MRSA Assay (FDA approved for NASAL specimens only), is one component of a comprehensive MRSA colonization surveillance program. It is not intended to diagnose MRSA infection nor to guide or monitor treatment for MRSA infections.   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 aWait culture results     The patient  is critically ill with multiple organ systems failure and requires high complexity decision making for assessment and support, frequent evaluation and titration of therapies, application of advanced monitoring technologies and extensive interpretation of multiple databases.   Critical Care Time devoted to patient care services described in this note is  30  Minutes. This time reflects time of care of this signee Dr Kalman Shan. This critical care time does not reflect procedure time, or teaching time or supervisory time of PA/NP/Med student/Med Resident etc but could involve care discussion time    Dr. Kalman Shan, M.D., [facility].C.P Pulmonary and Critical Care Medicine Staff Physician [facility]  Pulmonary and Critical Care Pager: [phone], If no answer or between  15:00h - 7:00h: call [phone]  11/20/2014 10:49 AM

## 2014-11-20 NOTE — Progress Notes (Signed)
[facility] FOR [facility] LAB REPLACEMENT ONLY  The patient does [facility] the [facility] Electroly[facility] Protocol based on the criteria listed below:   1. Is GFR >/= 40 ml/min? Yes.    Patient's GFR today is 45 2. Is urine output >/= 0.5 ml/kg/hr for the last 6 hours? Yes.   Patient's UOP is 0.55 ml/kg/hr 3. Is BUN < 60 mg/dL? Yes.    Patient's BUN today is 51 4. Abnormal electrolyte  K 3.1 5. Ordered repletion with: per protocol 6. If a panic level lab has been reported, has the CCM MD in charge been notified? Yes.  .   Physician:  Tonny BranchSommer  Sheehan Stacey, Lang Snowlizabeth McEachran 11/20/2014 6:02 AM

## 2014-11-20 NOTE — Care Management Note (Signed)
Case Management Note  Patient Details  Name: Chase Bright MRN: [id number] Date of Birth: 05/21/1941  Subjective/Objective:     Patient is eligible for the [facility] level of [facility].  Per physician can go to [facility] level of care once pressors are off.  Contacted daughter, [name].  Discussed ltach options, and explained that once physician determines stable can go to this level of care.  Daughter wished to go back to [facility] on discharge.  Physician, ltach laison and daughter aware that this may be this weekend.  Will pass on to weekend case manager also.             Action/Plan:   Expected Discharge Date:                  Expected Discharge Plan:  Long Term Acute Care (LTAC) (admitted from [facility])  In-House Referral:  NA, Clinical Social Work  Discharge planning Services  CM Consult  Post Acute Care Choice:    Choice offered to:     DME Arranged:    DME Agency:     HH Arranged:    HH Agency:     Status of Service:  In process, will continue to follow  Medicare Important Message Given:    Date Medicare IM Given:    Medicare IM give by:    Date Additional Medicare IM Given:    Additional Medicare Important Message give by:     If discussed at Long Length of Stay Meetings, dates discussed:    Additional Comments:  Vangie BickerBrown, Daivd Fredericksen Jane, RN 11/20/2014, 2:31 PM

## 2014-11-21 ENCOUNTER — Inpatient Hospital Stay (HOSPITAL_COMMUNITY): Payer: Medicare Other

## 2014-11-21 DIAGNOSIS — E440 Moderate protein-calorie malnutrition: Secondary | ICD-10-CM | POA: Insufficient documentation

## 2014-11-21 DIAGNOSIS — J9611 Chronic respiratory failure with hypoxia: Secondary | ICD-10-CM

## 2014-11-21 DIAGNOSIS — K567 Ileus, unspecified: Secondary | ICD-10-CM

## 2014-11-21 DIAGNOSIS — Z9911 Dependence on respirator [ventilator] status: Secondary | ICD-10-CM

## 2014-11-21 DIAGNOSIS — J9610 Chronic respiratory failure, unspecified whether with hypoxia or hypercapnia: Secondary | ICD-10-CM

## 2014-11-21 DIAGNOSIS — L8990 Pressure ulcer of unspecified site, unspecified stage: Secondary | ICD-10-CM | POA: Insufficient documentation

## 2014-11-21 DIAGNOSIS — N179 Acute kidney failure, unspecified: Secondary | ICD-10-CM

## 2014-11-21 LAB — BASIC METABOLIC PANEL
Anion gap: 11 (ref 5–15)
BUN: 30 mg/dL — ABNORMAL HIGH (ref 6–20)
CO2: 24 mmol/L (ref 22–32)
Calcium: 8 mg/dL — ABNORMAL LOW (ref 8.9–10.3)
Chloride: 103 mmol/L (ref 101–111)
Creatinine, Ser: 0.84 mg/dL (ref 0.61–1.24)
Glucose, Bld: 95 mg/dL (ref 65–99)
Potassium: 2.2 mmol/L — CL (ref 3.5–5.1)
Sodium: 138 mmol/L (ref 135–145)

## 2014-11-21 LAB — GLUCOSE, CAPILLARY
GLUCOSE-CAPILLARY: 127 mg/dL — AB (ref 65–99)
GLUCOSE-CAPILLARY: 74 mg/dL (ref 65–99)
GLUCOSE-CAPILLARY: 82 mg/dL (ref 65–99)
GLUCOSE-CAPILLARY: 82 mg/dL (ref 65–99)
GLUCOSE-CAPILLARY: 87 mg/dL (ref 65–99)
Glucose-Capillary: 69 mg/dL (ref 65–99)
Glucose-Capillary: 79 mg/dL (ref 65–99)
Glucose-Capillary: 96 mg/dL (ref 65–99)

## 2014-11-21 LAB — PROCALCITONIN: Procalcitonin: 10.1 ng/mL

## 2014-11-21 LAB — PROTIME-INR
INR: 4.91 — AB (ref 0.00–1.49)
Prothrombin Time: 44.3 seconds — ABNORMAL HIGH (ref 11.6–15.2)

## 2014-11-21 MED ORDER — POTASSIUM CHLORIDE 10 MEQ/50ML IV SOLN
10.0000 meq | INTRAVENOUS | Status: AC
  Administered 2014-11-21 (×6): 10 meq via INTRAVENOUS
  Filled 2014-11-21 (×5): qty 50

## 2014-11-21 MED ORDER — CETYLPYRIDINIUM CHLORIDE 0.05 % MT LIQD
7.0000 mL | Freq: Four times a day (QID) | OROMUCOSAL | Status: DC
  Administered 2014-11-21 – 2014-11-23 (×10): 7 mL via OROMUCOSAL

## 2014-11-21 MED ORDER — CHLORHEXIDINE GLUCONATE 0.12 % MT SOLN
15.0000 mL | Freq: Two times a day (BID) | OROMUCOSAL | Status: DC
  Administered 2014-11-21 – 2014-11-23 (×5): 15 mL via OROMUCOSAL
  Filled 2014-11-21 (×5): qty 15

## 2014-11-21 MED ORDER — DEXTROSE 50 % IV SOLN
25.0000 mL | Freq: Once | INTRAVENOUS | Status: AC
  Administered 2014-11-21: 25 mL via INTRAVENOUS

## 2014-11-21 MED ORDER — DEXTROSE 50 % IV SOLN
INTRAVENOUS | Status: AC
  Filled 2014-11-21: qty 50

## 2014-11-21 MED ORDER — POTASSIUM CHLORIDE 10 MEQ/50ML IV SOLN
INTRAVENOUS | Status: AC
  Filled 2014-11-21: qty 50

## 2014-11-21 MED ORDER — SODIUM CHLORIDE 0.9 % IV BOLUS (SEPSIS)
500.0000 mL | Freq: Once | INTRAVENOUS | Status: AC
  Administered 2014-11-21: 500 mL via INTRAVENOUS

## 2014-11-21 NOTE — Progress Notes (Signed)
  Recent Labs Lab 11/19/14 1410 11/19/14 1649 11/20/14 0521 11/21/14 1236  NA 131* 131* 131* 138  K 4.7 3.8 3.1* 2.2*  CL 89* 94* 92* 103  CO2 26  --  25 24  GLUCOSE 121* 126* 127* 95  BUN 55* 47* 51* 30*  CREATININE 1.85* 2.00* 1.49* 0.84  CALCIUM 9.2  --  8.2* 8.0*  MG 2.0  --  2.1  --   PHOS 5.1*  --  5.2*  --    A Low k Resolved AKI  Plan 10meq x 6 runs k   Dr. Kalman ShanMurali Jonny Longino, M.D., [facility].C.P Pulmonary and Critical Care Medicine Staff Physician [facility] [place] Pulmonary and Critical Care Pager: [phone], If no answer or between  15:00h - 7:00h: call [phone]  11/21/2014 1:53 PM

## 2014-11-21 NOTE — Progress Notes (Signed)
Hypoglycemic Event  CBG: 69  Treatment: D50 IV 25 mL  Symptoms: None  Follow-up CBG: Time:0415 CBG Result:124  Possible Reasons for Event: Inadequate meal intake  Comments/MD notified: [facility]     Stefani DamaKelley, Jorma Tassinari F  Remember to initiate Hypoglycemia Order Set & complete

## 2014-11-21 NOTE — Progress Notes (Signed)
RN called RT to patient room for patient desats.  Upon arrival sats were 88%, respiratory rate was increased along with minute ventilation.  Bag lavaged patient and suctioned out copious amount of thick yellow sputum.  Increased FIO2 to 100%.  Sats currently 97%.  Will continue to monitor.

## 2014-11-21 NOTE — Progress Notes (Addendum)
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  Initially [facility] was called to transport patient r/t constipation, vomiting but found pt to be pale, diaphoretic and in Afib with RVR.  In ED remained in AFib with RVR, septic shock with SBP 70's and PCCM called to admit.    SIGNIFICANT EVENTS: CT abd 6/2>>>    11/20/14: On chronic vent. On neo 60 and vasopressin shock dose. Wathing TV. CCS does not think patient has SBO given fact contrast entered distal colon but have advised bowel rest and G tube suction and conservative Rx.  PCT 32 -> 27 with Rx. C Diff negative.  Urine looks concentrate/cloudy    SUBJECTIVE/OVERNIGHT/INTERVAL HX 11/21/14: Per RN: 500cc TF residuals x 4h. CT yes with possible ileus Pressor needs down to neo 30mcg, PCT down. Creat better.  No other vent  VITAL SIGNS: Temp:  [97.8 F (36.6 C)-99.6 F (37.6 C)] 99.6 F (37.6 C) (06/04 0813) Pulse Rate:  [44-106] 94 (06/04 0800) Resp:  [17-32] 20 (06/04 0800) BP: (81-146)/(43-92) 111/63 mmHg (06/04 0800) SpO2:  [89 %-100 %] 98 % (06/04 0800) FiO2 (%):  [40 %-100 %] 40 % (06/04 0732) Weight:  [83.4 kg (183 lb 13.8 oz)] 83.4 kg (183 lb 13.8 oz) (06/04 0420) HEMODYNAMICS: CVP:  [3 mmHg] 3 mmHg VENTILATOR SETTINGS: Vent Mode:  [-] PRVC FiO2 (%):  [40 %-100 %] 40 % Set Rate:  [20 bmp] 20 bmp Vt Set:  [500 mL] 500 mL PEEP:  [5 cmH20] 5 cmH20 Plateau Pressure:  [13 cmH20-23 cmH20] 13 cmH20 INTAKE / OUTPUT:  Intake/Output Summary (Last 24 hours) at 11/21/14 1113 Last data filed at 11/21/14 0800  Gross per 24 hour  Intake 5790.13 ml  Output   2925 ml  Net 2865.13 ml    PHYSICAL EXAMINATION: General:  Acute on  chronically ill appearing vent dependent male,  Neuro:  Awake, tries to verbalize, no movement of upper or lower ext  HEENT:  # 6 cuffed trach, unremarkable Cardiovascular:  Tachy regular irreg  Lungs:  CTA anteriorly and up. Sync with vent Abdomen:  Distended, soft hypoactive appears tender to palp of lower quad  Musculoskeletal:  Minimal tone.  Skin:  Anasarca, + sacral ulcer +  LABS: PULMONARY  Recent Labs Lab 11/19/14 1639 11/19/14 1649 11/19/14 1900 11/20/14 0338  PHART 7.447  --   --  7.441  PCO2ART 37.8  --   --  34.1*  PO2ART 170.0*  --   --  92.0  HCO3 26.1*  --   --  23.4  TCO2 27 22  --  24  O2SAT 100.0  --  68.7 98.0    CBC  Recent Labs Lab 11/19/14 1410 11/19/14 1649 11/20/14 0521  HGB 9.0* 9.5* 9.4*  HCT 26.6* 28.0* 29.2*  WBC 34.2*  --  18.7*  PLT 593*  --  407*    COAGULATION  Recent Labs Lab 11/19/14 1410 11/20/14 0521 11/21/14 0415  INR 3.46* 4.54* 4.91*    CARDIAC  No results for input(s): TROPONINI in the last 168 hours. No results for input(s): PROBNP in the last 168 hours.   CHEMISTRY  Recent Labs Lab 11/19/14 1410 11/19/14 1649 11/20/14 [id number]  NA 131* 131* 131*  K 4.7 3.8 3.1*  CL 89* 94* 92*  CO2 26  --  25  GLUCOSE 121* 126* 127*  BUN 55* 47* 51*  CREATININE 1.85* 2.00* 1.49*  CALCIUM 9.2  --  8.2*  MG 2.0  --  2.1  PHOS 5.1*  --  5.2*   Estimated Creatinine Clearance: 43 mL/min (by C-G formula based on Cr of 1.49).   LIVER  Recent Labs Lab 11/19/14 1410 11/20/14 0521 11/21/14 0415  AST 39  --   --   ALT 25  --   --   ALKPHOS 174*  --   --   BILITOT 0.9  --   --   PROT 6.0*  --   --   ALBUMIN 2.0*  --   --   INR 3.46* 4.54* 4.91*     INFECTIOUS  Recent Labs Lab 11/19/14 1410 11/19/14 1418 11/19/14 1659 11/20/14 0521 11/21/14 0415  LATICACIDVEN  --  2.87* 1.67  --   --   PROCALCITON 41.28  --   --  27.65 10.10     ENDOCRINE CBG (last 3)   Recent Labs  11/21/14 0336 11/21/14 0414  11/21/14 0811  GLUCAP 69 127* 79         IMAGING x48h  - image(s) personally visualized  -   highlighted in bold Ct Abdomen Pelvis Wo Contrast  11/19/2014   CLINICAL DATA:  Small bowel obstruction.  EXAM: CT ABDOMEN AND PELVIS WITHOUT CONTRAST  TECHNIQUE: Multidetector CT imaging of the abdomen and pelvis was performed following the standard protocol without IV contrast.  COMPARISON:  None.  FINDINGS: There is airspace consolidation in both lower lobes right greater than left.  Lower chest:  Hepatobiliary: No focal liver abnormality identified. The lateral segment of left lobe and caudate lobe of liver appears slightly enlarged with respect to the right hepatic lobe. The contour the liver is slightly irregular. Gallbladder normal. No biliary dilatation.  Pancreas: Normal appearance of the pancreas.  Spleen: The spleen is unremarkable.  Adrenals/Urinary Tract: The adrenal glands are both normal. Bilateral renal cysts are noted. These are incompletely characterized without IV contrast. Bilateral renal calculi noted. The largest is in the inferior pole of right kidney measuring 7 mm, image 49 of series 201. Urinary bladder is obscured by beam hardening artifact from right hip arthroplasty device.  Stomach/Bowel: The patient has a gastrostomy tube. Increase caliber of the small bowel loops measuring up to 3.6 cm. There is enteric contrast material within the small bowel loops and within the colon up to the level of the sigmoid colon. The colon has a normal caliber.  Vascular/Lymphatic: Calcified atherosclerotic disease involves the abdominal aorta. No aneurysm. No enlarged retroperitoneal or mesenteric adenopathy. No enlarged pelvic or inguinal lymph nodes.  Reproductive: Prostate gland appears enlarged.  Other: No free air. There is no significant free fluid or abnormal fluid collections.  Musculoskeletal: Previous right hip arthroplasty. There is degenerative disc disease noted within the lumbar spine. This  is most advanced at the L5-S1 level.  IMPRESSION: 1. Increase caliber of the small bowel loops compatible with either partial small bowel obstruction or small bowel ileus. The enteric contrast material has passed through the small-bowel and into the distal colon. 2. Aortic atherosclerosis 3. Prostate gland enlargement 4. 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 5. Nephrolithiasis.   Electronically Signed   By: Signa Kell M.D.   On: 11/19/2014 17:23   Dg Chest Port 1  View  11/21/2014   CLINICAL DATA:  Chronic respiratory failure and hypoxia.  EXAM: PORTABLE CHEST - 1 VIEW  COMPARISON:  One-view chest 11/20/2014  FINDINGS: The heart is enlarged. Retrocardiac opacification is increased. Mild pulmonary vascular congestion is similar to the prior study. The tracheostomy tube and right IJ line are stable.  IMPRESSION: 1. Increasing retrocardiac opacification. While this likely reflects atelectasis, infection is also considered. 2. Cardiomegaly and mild pulmonary vascular congestion. 3. Support apparatus is stable.   Electronically Signed   By: Marin Roberts M.D.   On: 11/21/2014 07:31   Portable Chest Xray  11/20/2014   CLINICAL DATA:  Subsequent encounter for ventilator dependence.  EXAM: PORTABLE CHEST - 1 VIEW  COMPARISON:  11/19/2014.  FINDINGS: 10/1932 hrs. Low volume film with vascular congestion. Slight improvement in right lung airspace disease. Bibasilar atelectasis persist. No substantial pleural effusion. The cardio pericardial silhouette is enlarged. Tracheostomy tube again noted. Right IJ central line tip projects over the proximal to mid SVC region.  IMPRESSION: Low volume film with vascular congestion and interval slight improvement in right lung airspace disease.   Electronically Signed   By: Kennith Center M.D.   On: 11/20/2014 07:11   Portable Chest Xray  11/19/2014   CLINICAL DATA:  Central line placement.  EXAM: PORTABLE CHEST - 1 VIEW  COMPARISON:  Same day.   FINDINGS: Stable cardiomediastinal silhouette. Tracheostomy is unchanged and in grossly good position. Interval placement of right internal jugular catheter line with distal tip overlying expected position of the SVC. No pneumothorax or significant pleural effusion is noted. Minimal left basilar subsegmental atelectasis is noted. Stable opacities are noted in the right lung concerning for pneumonia or edema. Bony thorax is intact.  IMPRESSION: Stable right lung opacity concerning for pneumonia or edema. Minimal left basilar subsegmental atelectasis is noted. Interval placement of right internal jugular catheter line with distal tip overlying expected position of the SVC.   Electronically Signed   By: Lupita Raider, M.D.   On: 11/19/2014 16:10   Dg Chest Port 1 View  11/19/2014   CLINICAL DATA:  Hypoxia.  Guillain-Barre syndrome.  Sepsis.  EXAM: PORTABLE CHEST - 1 VIEW  COMPARISON:  None.  FINDINGS: Tracheostomy catheter tip is 4.7 cm above the carina. No pneumothorax. There is patchy predominantly interstitial infiltrate throughout much of the right mid and lower lung zones. There is mild left base atelectasis. Heart size and pulmonary vascularity are normal. No adenopathy. No bone lesions.  IMPRESSION: There is patchy predominantly interstitial infiltrate throughout much of the right mid and lower lung zones. Question aspiration given the appearance. Mild left base atelectasis. Tracheostomy as described. No pneumothorax.   Electronically Signed   By: Bretta Bang III M.D.   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  Cortisol 65  P:  Volume resuscitation  Neo fopr MAP goal >  65 Chronic coumadin (hold for now) No role for hydrocort due to high coristol  RENAL AKI Dehydration  Lactic acidosis in setting of sepsis  - improving creat but urine looks cloudy / concentrated  P:   Saline at 125cc/h > give another 0.5L bolus Renal dose meds Recheck BMET 11/21/2014   GASTROINTESTINAL ?bowel obstruction vs toxic megacolon in setting of Cdiff prior to admit Nausea/vomiting  Severe protein calorie malnutrition  GERD   - cCS rec conservative Rx 10/20/14. C Diff neative 11/20/14. CT with possible ileuys 11/20/14. RN 11/21/14 - reporting increased resdiual  P:   G tube to suction NPO Keep K > 4 Recheck KUB stat  HEMATOLOGIC A:   Chronic anticoagulation (on coumadin) Normocytic normochromic anemia w/out current evidence of bleeding  P:  Trend CBC Ck INR Hold coumadin for now; change to IC heparin given acute illness   INFECTIOUS Results for orders placed or performed during the hospital encounter of 11/19/14  Blood Culture (routine x 2)     Status: None (Preliminary result)   Collection Time: 11/19/14  1:59 PM  Result Value Ref Range Status   Specimen Description BLOOD RIGHT NECK  Final   Special Requests BOTTLES DRAWN AEROBIC AND ANAEROBIC 5CC  Final   Culture   Final           BLOOD CULTURE RECEIVED NO GROWTH TO DATE CULTURE WILL BE HELD FOR 5 DAYS BEFORE ISSUING A FINAL NEGATIVE REPORT Performed at [facility]    Report Status PENDING  Incomplete  Urine culture     Status: None (Preliminary result)   Collection Time: 11/19/14  3:59 PM  Result Value Ref Range Status   Specimen Description URINE, CATHETERIZED  Final   Special Requests NONE  Final   Colony Count   Final    >=100,000 COLONIES/ML Performed at [facility]    Culture   Final    PROTEUS MIRABILIS Performed at [facility]    Report Status PENDING  Incomplete  Blood Culture (routine x 2)     Status: None (Preliminary result)   Collection Time: 11/19/14  4:28 PM   Result Value Ref Range Status   Specimen Description BLOOD RIGHT ARM  Final   Special Requests BOTTLES DRAWN AEROBIC AND ANAEROBIC 5CC  Final   Culture   Final           BLOOD CULTURE RECEIVED NO GROWTH TO DATE CULTURE WILL BE HELD FOR 5 DAYS BEFORE ISSUING A FINAL NEGATIVE REPORT Performed at [facility]    Report Status PENDING  Incomplete  MRSA PCR Screening     Status: None   Collection Time: 11/19/14  5:27 PM  Result Value Ref Range Status   MRSA by PCR NEGATIVE NEGATIVE Final    Comment:        The GeneXpert MRSA Assay (FDA approved for NASAL specimens only), is one component of a comprehensive MRSA colonization surveillance program. It is not intended to diagnose MRSA infection nor to guide or monitor treatment for MRSA infections.   Clostridium Difficile by PCR     Status: None   Collection Time: 11/20/14  3:24 AM  Result Value Ref Range Status   C difficile by pcr NEGATIVE NEGATIVE Final    A:   Diarrhea  CDIFF - negative  HCAP vs aspiration  P:   vanc 6/2>>> Zosyn 6/2>>> Flagyl 6/2>>> 6/3 vanc (PEG) 6/2>>>6/3  ENDOCRINE A:  No acute  P:   Trend cbg  NEUROLOGIC A:   Functional quadriplegia s/p Karlene Lineman which failed to respond to plasmaphoresis  P:   RASS goal: -1 PAD protocol  Supportive care   DERM A:  Per Wound care:  Pre-Admit: Unstageable wound 6X8cm; 50% yellow slough, 50% red and moist; appearance is consistent with a previous deep tissue injury which has evolved  P Per wound care  FAMILY  - Updates: pending  no family at bedsideagai 11/21/14  - Inter-disciplinary family meet or Palliative Care meeting due by:  6/9  GLOBAL Septic shock continues. aWait culture results. Ileus +, recheck KUB, Keep K > 4. No dc 11/21/2014      The patient is critically ill with multiple organ systems failure and requires high complexity decision making for assessment and support, frequent evaluation and titration of therapies, application  of advanced monitoring technologies and extensive interpretation of multiple databases.   Critical Care Time devoted to patient care services described in this note is  30  Minutes. This time reflects time of care of this signee Dr Kalman Shan. This critical care time does not reflect procedure time, or teaching time or supervisory time of PA/NP/Med student/Med Resident etc but could involve care discussion time    Dr. Kalman Shan, M.D., [facility].C.P Pulmonary and Critical Care Medicine Staff Physician [facility] Pulmonary and Critical Care Pager: [phone], If no answer or between  15:00h - 7:00h: call [phone]  11/21/2014 11:13 AM

## 2014-11-21 NOTE — Clinical Documentation Improvement (Signed)
MD's, NP's, and PA's  Noted urine culture results listed below:  URINE, CATHETERIZED    Special Requests NONE   Colony Count  >=100,000 COLONIES/ML   : Culture  PROTEUS MIRABILIS   Please document clinical conditions pertaining to lab values if appropriate for this admission.  Thank you  Possible Clinical Conditions?    UTI  Bacturia  Other Condition                 Cannot Clinically Determine    Risk Factors: Functional quadriplegia, Sepsis, ILeus, Acute Renal Failure    Treatment: IV Antibiotics  Thank You, Lavonda JumboLawanda J Ina Scrivens ,RN Clinical Documentation Specialist:  [phone]  [facility]- Health Information Management

## 2014-11-22 ENCOUNTER — Inpatient Hospital Stay (HOSPITAL_COMMUNITY): Payer: Medicare Other

## 2014-11-22 DIAGNOSIS — N390 Urinary tract infection, site not specified: Secondary | ICD-10-CM

## 2014-11-22 DIAGNOSIS — B964 Proteus (mirabilis) (morganii) as the cause of diseases classified elsewhere: Secondary | ICD-10-CM

## 2014-11-22 LAB — CBC WITH DIFFERENTIAL/PLATELET
Basophils Absolute: 0 10*3/uL (ref 0.0–0.1)
Basophils Relative: 0 % (ref 0–1)
EOS PCT: 1 % (ref 0–5)
Eosinophils Absolute: 0.1 10*3/uL (ref 0.0–0.7)
HEMATOCRIT: 27 % — AB (ref 39.0–52.0)
Hemoglobin: 8.7 g/dL — ABNORMAL LOW (ref 13.0–17.0)
LYMPHS ABS: 0.9 10*3/uL (ref 0.7–4.0)
LYMPHS PCT: 9 % — AB (ref 12–46)
MCH: 28.7 pg (ref 26.0–34.0)
MCHC: 32.2 g/dL (ref 30.0–36.0)
MCV: 89.1 fL (ref 78.0–100.0)
Monocytes Absolute: 0.6 10*3/uL (ref 0.1–1.0)
Monocytes Relative: 6 % (ref 3–12)
NEUTROS PCT: 85 % — AB (ref 43–77)
Neutro Abs: 8.3 10*3/uL — ABNORMAL HIGH (ref 1.7–7.7)
PLATELETS: 336 10*3/uL (ref 150–400)
RBC: 3.03 MIL/uL — ABNORMAL LOW (ref 4.22–5.81)
RDW: 15.9 % — ABNORMAL HIGH (ref 11.5–15.5)
WBC: 9.8 10*3/uL (ref 4.0–10.5)

## 2014-11-22 LAB — URINE CULTURE

## 2014-11-22 LAB — PROTIME-INR
INR: 4.86 — AB (ref 0.00–1.49)
Prothrombin Time: 44 seconds — ABNORMAL HIGH (ref 11.6–15.2)

## 2014-11-22 LAB — GLUCOSE, CAPILLARY
Glucose-Capillary: 72 mg/dL (ref 65–99)
Glucose-Capillary: 76 mg/dL (ref 65–99)
Glucose-Capillary: 80 mg/dL (ref 65–99)
Glucose-Capillary: 83 mg/dL (ref 65–99)
Glucose-Capillary: 85 mg/dL (ref 65–99)
Glucose-Capillary: 86 mg/dL (ref 65–99)

## 2014-11-22 LAB — TROPONIN I: TROPONIN I: 0.03 ng/mL (ref <0.031)

## 2014-11-22 LAB — BASIC METABOLIC PANEL
Anion gap: 10 (ref 5–15)
BUN: 24 mg/dL — AB (ref 6–20)
CALCIUM: 8 mg/dL — AB (ref 8.9–10.3)
CHLORIDE: 107 mmol/L (ref 101–111)
CO2: 24 mmol/L (ref 22–32)
CREATININE: 0.65 mg/dL (ref 0.61–1.24)
GFR calc non Af Amer: >60 mL/min (ref >60)
Glucose, Bld: 87 mg/dL (ref 65–99)
Potassium: 2.7 mmol/L — CL (ref 3.5–5.1)
SODIUM: 141 mmol/L (ref 135–145)

## 2014-11-22 LAB — MAGNESIUM: MAGNESIUM: 1.8 mg/dL (ref 1.7–2.4)

## 2014-11-22 LAB — PHOSPHORUS: Phosphorus: 2.2 mg/dL — ABNORMAL LOW (ref 2.5–4.6)

## 2014-11-22 MED ORDER — DEXTROSE-NACL 5-0.45 % IV SOLN
INTRAVENOUS | Status: DC
  Administered 2014-11-22 – 2014-11-23 (×2): via INTRAVENOUS

## 2014-11-22 MED ORDER — FENTANYL CITRATE (PF) 100 MCG/2ML IJ SOLN
12.5000 ug | INTRAMUSCULAR | Status: DC | PRN
  Administered 2014-11-22 – 2014-11-23 (×4): 12.5 ug via INTRAVENOUS
  Filled 2014-11-22 (×4): qty 2

## 2014-11-22 MED ORDER — POTASSIUM CHLORIDE 10 MEQ/50ML IV SOLN
10.0000 meq | INTRAVENOUS | Status: AC
  Administered 2014-11-22 (×5): 10 meq via INTRAVENOUS
  Filled 2014-11-22 (×5): qty 50

## 2014-11-22 MED ORDER — DEXTROSE 5 % IV SOLN
10.0000 mmol | Freq: Once | INTRAVENOUS | Status: AC
  Administered 2014-11-22: 10 mmol via INTRAVENOUS
  Filled 2014-11-22: qty 3.33

## 2014-11-22 MED ORDER — MAGNESIUM SULFATE 2 GM/50ML IV SOLN
2.0000 g | Freq: Once | INTRAVENOUS | Status: AC
  Administered 2014-11-22: 2 g via INTRAVENOUS
  Filled 2014-11-22: qty 50

## 2014-11-22 NOTE — Progress Notes (Signed)
eLink Physician-Brief Progress Note Patient Name: Micheal Likensugustus Lackman DOB: 05/09/1941 MRN: [id number]   Date of Service  11/22/2014  HPI/Events of Note    eICU Interventions  Potassium ordered     Intervention Category Intermediate Interventions: Electrolyte abnormality - evaluation and management  Meshulem Onorato S. 11/22/2014, 5:45 AM

## 2014-11-22 NOTE — Progress Notes (Signed)
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  Initially [facility] was called to transport patient r/t constipation, vomiting but found pt to be pale, diaphoretic and in Afib with RVR.  In ED remained in AFib with RVR, septic shock with SBP 70's and PCCM called to admit.    SIGNIFICANT EVENTS: CT abd 6/2>>>    11/20/14: On chronic vent. On neo 60 and vasopressin shock dose. Wathing TV. CCS does not think patient has SBO given fact contrast entered distal colon but have advised bowel rest and G tube suction and conservative Rx.  PCT 32 -> 27 with Rx. C Diff negative.  Urine looks concentrate/cloudy   11/21/14: Per RN: 500cc TF residuals x 4h. CT yes with possible ileus Pressor needs down to neo , PCT down. Creat better.  No other vent   SUBJECTIVE/OVERNIGHT/INTERVAL HX 11/22/14:  Off pressors per RN. INR spont high still in pre0admit coumadin + current ileus/sepsis per  Pharmacy. Urine cutlure - proteus. Sensit pending. Rn concerned about bilateral UE edema. Ileus improving - on AXR yesterday  VITAL SIGNS: Temp:  [98 F (36.7 C)-100.1 F (37.8 C)] 99.4 F (37.4 C) (06/05 0808) Pulse Rate:  [33-116] 98 (06/05 0800) Resp:  [11-37] 24 (06/05 0800) BP: (86-134)/(49-119) 134/119 mmHg (06/05 0800) SpO2:  [89 %-100 %] 99 % (06/05 0800) FiO2 (%):  [40 %-100 %] 40 % (06/05 0742) Weight:  [84.6 kg (186 lb 8.2 oz)] 84.6 kg (186 lb 8.2 oz) (06/05 0500) HEMODYNAMICS: CVP:  [6 mmHg] 6 mmHg VENTILATOR SETTINGS: Vent Mode:  [-] PRVC FiO2 (%):  [40 %-100 %] 40 % Set Rate:  [20 bmp] 20 bmp Vt Set:  [500 mL] 500 mL PEEP:  [5 cmH20] 5 cmH20 Plateau Pressure:  [9 cmH20-20 cmH20] 9 cmH20 INTAKE /  OUTPUT:  Intake/Output Summary (Last 24 hours) at 11/22/14 1100 Last data filed at 11/22/14 0800  Gross per 24 hour  Intake 2727.72 ml  Output   1845 ml  Net 882.72 ml    PHYSICAL EXAMINATION: General:  Acute on chronically ill appearing vent dependent male,  Looking better.  Neuro:  Awake, tries to verbalize, no movement of upper or lower ext  Smiling HEENT:  # 6 cuffed trach, unremarkable Cardiovascular:  Tachy regular irreg  Lungs:  CTA anteriorly and up. Sync with vent Abdomen:  Distended, soft hypoactive appears tender to palp of lower quad  Musculoskeletal:  Minimal tone.  Skin:  Anasarca, + sacral ulcer +  LABS: PULMONARY  Recent Labs Lab 11/19/14 1639 11/19/14 1649 11/19/14 1900 11/20/14 0338  PHART 7.447  --   --  7.441  PCO2ART 37.8  --   --  34.1*  PO2ART 170.0*  --   --  92.0  HCO3 26.1*  --   --  23.4  TCO2 27 22  --  24  O2SAT 100.0  --  68.7 98.0    CBC  Recent Labs Lab 11/19/14 1410 11/19/14 1649 11/20/14 0521 11/22/14 0405  HGB 9.0* 9.5* 9.4* 8.7*  HCT 26.6* 28.0* 29.2* 27.0*  WBC 34.2*  --  18.7* 9.8  PLT 593*  --  407* 336    COAGULATION  Recent Labs Lab 11/19/14 1410  11/20/14 0521 11/21/14 0415 11/22/14 0405  INR 3.46* 4.54* 4.91* 4.86*    CARDIAC    Recent Labs Lab 11/22/14 0405  TROPONINI 0.03   No results for input(s): PROBNP in the last 168 hours.   CHEMISTRY  Recent Labs Lab 11/19/14 1410 11/19/14 1649 11/20/14 0521 11/21/14 1236 11/22/14 0405  NA 131* 131* 131* 138 141  K 4.7 3.8 3.1* 2.2* 2.7*  CL 89* 94* 92* 103 107  CO2 26  --  25 24 24   GLUCOSE 121* 126* 127* 95 87  BUN 55* 47* 51* 30* 24*  CREATININE 1.85* 2.00* 1.49* 0.84 0.65  CALCIUM 9.2  --  8.2* 8.0* 8.0*  MG 2.0  --  2.1  --  1.8  PHOS 5.1*  --  5.2*  --  2.2*   Estimated Creatinine Clearance: 80.7 mL/min (by C-G formula based on Cr of 0.65).   LIVER  Recent Labs Lab 11/19/14 1410 11/20/14 0521 11/21/14 0415 11/22/14 0405  AST  39  --   --   --   ALT 25  --   --   --   ALKPHOS 174*  --   --   --   BILITOT 0.9  --   --   --   PROT 6.0*  --   --   --   ALBUMIN 2.0*  --   --   --   INR 3.46* 4.54* 4.91* 4.86*     INFECTIOUS  Recent Labs Lab 11/19/14 1410 11/19/14 1418 11/19/14 1659 11/20/14 0521 11/21/14 0415  LATICACIDVEN  --  2.87* 1.67  --   --   PROCALCITON 41.28  --   --  27.65 10.10     ENDOCRINE CBG (last 3)   Recent Labs  11/22/14 11/22/14 0406 11/22/14 0808  GLUCAP 86 85 83         IMAGING x48h  - image(s) personally visualized  -   highlighted in bold Dg Chest Port 1 View  11/21/2014   CLINICAL DATA:  Chronic respiratory failure and hypoxia.  EXAM: PORTABLE CHEST - 1 VIEW  COMPARISON:  One-view chest 11/20/2014  FINDINGS: The heart is enlarged. Retrocardiac opacification is increased. Mild pulmonary vascular congestion is similar to the prior study. The tracheostomy tube and right IJ line are stable.  IMPRESSION: 1. Increasing retrocardiac opacification. While this likely reflects atelectasis, infection is also considered. 2. Cardiomegaly and mild pulmonary vascular congestion. 3. Support apparatus is stable.   Electronically Signed   By: Marin Robertshristopher  Mattern M.D.   On: 11/21/2014 07:31   Dg Abd Portable 1v  11/21/2014   CLINICAL DATA:  Ileus.  EXAM: PORTABLE ABDOMEN - 1 VIEW  COMPARISON:  CT abdomen and pelvis 11/19/2014.  FINDINGS: Dilated loops of small bowel within the left lower quadrant per cysts. The number of dilated loops has decreased. Gas and stool are present in the distal colon.  IMPRESSION: 1. Decreased number of dilated bowel loops with some persistence in the left lower quadrant.   Electronically Signed   By: Marin Robertshristopher  Mattern M.D.   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  ?aspiration PNA vs HCAP   - cannot wean due to GBS/Quad  P:   Maintain vent  support  F/u ABG  F/u CXR  Empiric abx as below  PRN BD   CARDIOVASCULAR Septic shock  Afib with RVR  R IJ TLC  6/2>>>   -  OFf vasopressors x 12-18h ago 11/21/14  P:  Decrease fluid rate  dcc vasipresors Chronic coumadin (hold for now) - INR still high Start diuresis for 3rd spacing 11/23/14 No role for hydrocort due to high coristol  RENAL AKI Dehydration  Lactic acidosis in setting of sepsis due to UTI/proteus  -resolved AKI but 11/22/2014 has  - Hypokalemia - sevvere, , Hypomagnesemia - very mild, Hypophosphatemia - very mild  P:   d5 half normal saline at 50c/h . Aim to KVO/saline lock 11/23/14 Renal dose meds Recheck BMET   GASTROINTESTINAL ?bowel obstruction vs toxic megacolon in setting of Cdiff prior to admit Nausea/vomiting  Severe protein calorie malnutrition  GERD   - cCS rec conservative Rx 10/20/14. C Diff neative 11/20/14. CT with possible ileuys 11/20/14. - RN reports improvig residual 11/22/14. AXR 11/21/14 improved  P:   G tube to suction NPO Keep K > 4  HEMATOLOGIC A:   Chronic anticoagulation (on coumadin) Normocytic normochromic anemia w/out current evidence of bleeding    - INR still very high -due to sepsis + no gi intake  P:  Trend CBC Ck INR Hold coumadin for now;  Check UE Duplex (has edema)   INFECTIOUS Urine - 6/2 - proteus MRSA pcr - negative C Diff - negative Blood - ngsf  A:   Diarrhea  At presenation was likely due to UTI HCAP vs aspiration  At admit  UTI  - confirmed 11/22/14 with culture return as proteus - sensit pending  P:   vanc 6/2>>>11/22/14 Zosyn 6/2>>> (narrow when sensit back) Flagyl 6/2>>> 6/3 vanc (PEG) 6/2>>>6/3  ENDOCRINE A:  No acute  P:   Trend cbg  NEUROLOGIC A:   Functional quadriplegia s/p Karlene Lineman which failed to respond to plasmaphoresis  P:   RASS goal: -1 PAD protocol  Supportive care  Fent prn  DERM A:  Per Wound care: Pre-Admit: Unstageable wound 6X8cm; 50% yellow slough, 50%  red and moist; appearance is consistent with a previous deep tissue injury which has evolved  P Per wound care  FAMILY  - Updates: pending  no family at bedsideagai 11/21/14, 11/22/2014 but patient updated  - Inter-disciplinary family meet or Palliative Care meeting due by:  6/9  GLOBAL Septic shock resolved. AKI ersolved. Dx is Proteus UTI. Correct electrolytes. Move to sDU. Aim for kidnred trasnfer 11/22/2014 or 11/23/14. PCM primary    Dr. Kalman Shan, M.D., [facility].C.P Pulmonary and Critical Care Medicine Staff Physician [facility] Pulmonary and Critical Care Pager: [phone], If no answer or between  15:00h - 7:00h: call [phone]  11/22/2014 11:00 AM

## 2014-11-22 NOTE — Progress Notes (Signed)
CRITICAL VALUE ALERT  Critical value received:  K+ 2.7  Date of notification:  11/22/14  Time of notification:  0544  Critical value read back:Yes.    Nurse who received alert:  Stefani DamaKristina Manuela Halbur  MD notified (1st page):  E-Link- Dr. Delton CoombesByrum  Time of first page:  [phone]  Responding MD:  Dr. Delton CoombesByrum  Time MD responded:  [phone]

## 2014-11-23 ENCOUNTER — Inpatient Hospital Stay (HOSPITAL_COMMUNITY): Payer: Medicare Other

## 2014-11-23 DIAGNOSIS — R609 Edema, unspecified: Secondary | ICD-10-CM

## 2014-11-23 LAB — CULTURE, RESPIRATORY

## 2014-11-23 LAB — CBC WITH DIFFERENTIAL/PLATELET
Basophils Absolute: 0 10*3/uL (ref 0.0–0.1)
Basophils Relative: 0 % (ref 0–1)
Eosinophils Absolute: 0.2 10*3/uL (ref 0.0–0.7)
Eosinophils Relative: 1 % (ref 0–5)
HEMATOCRIT: 28.4 % — AB (ref 39.0–52.0)
Hemoglobin: 8.8 g/dL — ABNORMAL LOW (ref 13.0–17.0)
LYMPHS ABS: 0.8 10*3/uL (ref 0.7–4.0)
Lymphocytes Relative: 7 % — ABNORMAL LOW (ref 12–46)
MCH: 27.7 pg (ref 26.0–34.0)
MCHC: 31 g/dL (ref 30.0–36.0)
MCV: 89.3 fL (ref 78.0–100.0)
Monocytes Absolute: 0.8 10*3/uL (ref 0.1–1.0)
Monocytes Relative: 7 % (ref 3–12)
Neutro Abs: 10.3 10*3/uL — ABNORMAL HIGH (ref 1.7–7.7)
Neutrophils Relative %: 85 % — ABNORMAL HIGH (ref 43–77)
Platelets: 326 10*3/uL (ref 150–400)
RBC: 3.18 MIL/uL — AB (ref 4.22–5.81)
RDW: 16 % — ABNORMAL HIGH (ref 11.5–15.5)
WBC: 12.1 10*3/uL — AB (ref 4.0–10.5)

## 2014-11-23 LAB — BASIC METABOLIC PANEL
ANION GAP: 8 (ref 5–15)
BUN: 16 mg/dL (ref 6–20)
CALCIUM: 8.1 mg/dL — AB (ref 8.9–10.3)
CHLORIDE: 111 mmol/L (ref 101–111)
CO2: 23 mmol/L (ref 22–32)
Creatinine, Ser: 0.5 mg/dL — ABNORMAL LOW (ref 0.61–1.24)
GFR calc non Af Amer: >60 mL/min (ref >60)
Glucose, Bld: 79 mg/dL (ref 65–99)
Potassium: 3.1 mmol/L — ABNORMAL LOW (ref 3.5–5.1)
Sodium: 142 mmol/L (ref 135–145)

## 2014-11-23 LAB — MAGNESIUM: MAGNESIUM: 2 mg/dL (ref 1.7–2.4)

## 2014-11-23 LAB — PROTIME-INR
INR: 4.9 — ABNORMAL HIGH (ref 0.00–1.49)
Prothrombin Time: 44.3 seconds — ABNORMAL HIGH (ref 11.6–15.2)

## 2014-11-23 LAB — CULTURE, RESPIRATORY W GRAM STAIN

## 2014-11-23 LAB — GLUCOSE, CAPILLARY
GLUCOSE-CAPILLARY: 73 mg/dL (ref 65–99)
GLUCOSE-CAPILLARY: 76 mg/dL (ref 65–99)
GLUCOSE-CAPILLARY: 77 mg/dL (ref 65–99)
Glucose-Capillary: 73 mg/dL (ref 65–99)

## 2014-11-23 LAB — PHOSPHORUS: PHOSPHORUS: 2.4 mg/dL — AB (ref 2.5–4.6)

## 2014-11-23 LAB — BRAIN NATRIURETIC PEPTIDE: B Natriuretic Peptide: 650.5 pg/mL — ABNORMAL HIGH (ref 0.0–100.0)

## 2014-11-23 MED ORDER — DEXTROSE 5 % IV SOLN: 10.0000 mg | Freq: Once | INTRAVENOUS | Status: DC

## 2014-11-23 MED ORDER — FUROSEMIDE 10 MG/ML IJ SOLN
40.0000 mg | Freq: Once | INTRAMUSCULAR | Status: AC
  Administered 2014-11-23: 40 mg via INTRAVENOUS

## 2014-11-23 MED ORDER — POTASSIUM CHLORIDE 10 MEQ/50ML IV SOLN: 10.0000 meq | INTRAVENOUS | Status: DC

## 2014-11-23 MED ORDER — CARVEDILOL 6.25 MG PO TABS
6.2500 mg | ORAL_TABLET | Freq: Two times a day (BID) | ORAL | Status: DC
  Administered 2014-11-23: 6.25 mg
  Filled 2014-11-23 (×3): qty 1

## 2014-11-23 MED ORDER — CEFTRIAXONE SODIUM IN DEXTROSE 20 MG/ML IV SOLN: 1.0000 g | INTRAVENOUS | Status: AC

## 2014-11-23 MED ORDER — POTASSIUM PHOSPHATES 15 MMOLE/5ML IV SOLN
20.0000 mmol | Freq: Once | INTRAVENOUS | Status: AC
  Administered 2014-11-23: 20 mmol via INTRAVENOUS
  Filled 2014-11-23: qty 6.67

## 2014-11-23 MED ORDER — FUROSEMIDE 10 MG/ML IJ SOLN
INTRAMUSCULAR | Status: AC
  Administered 2014-11-23: 12:00:00
  Filled 2014-11-23: qty 4

## 2014-11-23 MED ORDER — CEFTRIAXONE SODIUM IN DEXTROSE 20 MG/ML IV SOLN
1.0000 g | INTRAVENOUS | Status: DC
  Administered 2014-11-23: 1 g via INTRAVENOUS
  Filled 2014-11-23: qty 50

## 2014-11-23 MED ORDER — COLLAGENASE 250 UNIT/GM EX OINT: TOPICAL_OINTMENT | Freq: Every day | CUTANEOUS | Status: AC

## 2014-11-23 MED ORDER — VALPROATE SODIUM 250 MG/5ML PO SYRP
250.0000 mg | ORAL_SOLUTION | Freq: Two times a day (BID) | ORAL | Status: DC
  Administered 2014-11-23: 250 mg via ORAL
  Filled 2014-11-23 (×18): qty 5

## 2014-11-23 MED ORDER — POTASSIUM CHLORIDE 10 MEQ/50ML IV SOLN
10.0000 meq | INTRAVENOUS | Status: AC
  Administered 2014-11-23 (×4): 10 meq via INTRAVENOUS
  Filled 2014-11-23 (×3): qty 50

## 2014-11-23 NOTE — Discharge Summary (Signed)
Physician Discharge Summary  Patient ID: Chase Bright MRN: [id number] DOB/AGE: 74-Oct-1942 75 y.o.  Admit date: 11/19/2014 Discharge date: 11/23/2014    Discharge Diagnoses:  Chronic Ventilator Dependence with Tracheostomy  Aspiration PNA vs HCAP ([place])  Septic Shock  Atrial Fibrillation with RVR AKI Hypokalemia  Dehydration  Lactic Acidosis  Proteus UTI  Nausea / Vomiting  Diarrhea - C. Diff neg during admission Severe Protein Calorie Malnutrition  ? 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  GERD Chronic Anticoagulation  Coagulopathy  Normocytic Normochromic Anemia  Functional Quadriplegia s/p Guillain Barre (failed to respond to plasmaphoresis) Bipolar Disorder                                                                          DISCHARGE PLAN BY DIAGNOSIS     Chronic Ventilator Dependence with Tracheostomy  Aspiration PNA vs HCAP (Klebsiella, res ampicillin otherwise sens)  Discharge Plan: Continue MV Support:  PRVC 500 / 20 / 5 / 40% Intermittent CXR  Complete Rocephin as ordered   Septic Shock secondary to Proteus UTI, +/- Klebsiella PNA Atrial Fibrillation with RVR HTN   Discharge Plan: Resume Coreg Hold home lasix, amiodarone for now with concern for ileus.    AKI Hypokalemia Dehydration  Lactic Acidosis Proteus UTI (res nitrofurantoin, otherwise sens)  Discharge Plan: Complete Rocephin as ordered  Daily BMP until ileus issues resolved Goal for K+ > 4.0    Nausea / Vomiting  Diarrhea - C. Diff neg during admission Severe Protein Calorie Malnutrition  ? 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 Pt is on a Sport low airloss bed to reduce pressure.                 DISCHARGE SUMMARY   Chase Bright a 74y.o. 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 Initially [facility] was called to transport patient related to reports of constipation & vomiting but on arrival, he was found pt to be pale, diaphoretic and in Afib with RVR. In ED remained in AFib with RVR, septic shock with SBP 70's and PCCM called to admit. He was admitted to ICU.  The patient was volume resuscitated, pan cultured and treated empirically for septic shock.  Central line was placed for vasopressor support.  CODE STATUS was clarified with family and they requested vasopressors only, no CPR or Cardioversion. CT of the abdomen was assessed which showed an increase in the caliber of small bowel loops compatible with partial small bowel obstruction or small bowel ileus, aortic atherosclerosis, prostate gland enlargement, features of the liver suggestive of early cirrhosis, & nephrolithiasis.   There was no evidence of bowel ischemia or perforation, no enteritis or  colitis.  He was evaluated by [facility].  Contrast was seen in the distal colon on CT.  Recommendations were for fluid resuscitation, bowel rest and G-Tube to suction. He transiently required vasopressor support.  ICU course was complicated by hypokalemia in the setting of partial small bowel obstruction.  Sputum culture grew Klebsiella which was resistant to ampicillin, otherwise sensitive.  Urine culture was positive for proteus mirabilis which was resistant to nitrofurantion, otherwise sensitive.  Antibiotics were narrowed.  Vasopressors were weaned off.  He remained hemodynamically stable.  Unstageable sacral ulcer was assessed by WOC on admission (see recommendations above).  The patient continues to have PEG to to LIS.  Recommendations as above.  The patient was medically cleared for discharge back to [facility] on 6/6 with plans as above.                SIGNIFICANT EVENTS: 6/02  Admit with diarrhea, nausea/vomiting, hypotension.  Partial Code:  Pressors only, no CPR or Cardioversion 6/03  Remains on neo 60 and vasopressin shock dose.  CCS does not think patient has SBO given fact contrast entered distal colon but have advised bowel rest and G tube suction and conservative Rx. PCT 32 -> 27 with Rx. C Diff negative. Urine looks concentrate/cloudy 6/05  Off pressors 6/06  Hemodynamically stable, PEG to suction with ongoing output ~ 649m overnight   SIGNIFICANT DIAGNOSTIC STUDIES ABD CT 6/2 >> increase caliber of small bowel loops compatible with partial small bowel obstruction or small bowel ileus, aortic atherosclerosis, prostate gland enlargement, features of the liver suggestive of early cirrhosis, nephrolithiasis   MICRO DATA  MRSA PCR 6/2 >> neg  BCx2 6/2 >>  UC 6/2 >> proteus mirabilis >> res to nitrofurantoin, otherwise sens Sputum 6/3 >> Klebsiella pneumoniae >> res ampicillin, otherwise sens   ANTIBIOTICS Vanc 6/2>> 6/5 Zosyn 6/2 >> 6/6 Flagyl 6/2 >>  6/3 Vanc (PEG) 6/2 >> 6/3 Rocephin 6/6 >> x3 more doses   CONSULTS [facility]  W[facility]   TUBES / LINES Chronic # 6 Cuffed Shiley Trach >>  Chronic PEG >> R IJ TLC 6/2 >> THIS SHOULD BE REMOVED BY 6/13   Discharge Exam: General:  Chronically ill appearing male in NAD Neuro:  Awake, alert, interacts with staff, appears appropriate.  Generalized weakness HEENT:  #6 cuffed trach midline, c/d/i CV: s1s2 tachy / irr with PVC's, no m/r/g PULM: resp's even/non-labored on vent, lungs bilaterally CTA anterior/lateral GI: PEG site c/d/i, abd NTND, bsx4 active  Skin:  Warm/dry, unstageable sacral wound 6x8 cm, appearance c/w previous deep tissue injury, pink/yellow drainage Extremities: generalized edema, heels dressed with foam protective dressing   Filed Vitals:   11/23/14 0800 11/23/14 0801 11/23/14 0900 11/23/14 1000  BP: 131/68 131/68 127/92 125/87  Pulse:  126 42 103  Temp:  98 F (36.7 C)    TempSrc:  Oral    Resp: _0 Height:  Weight:      SpO2: 100% 100% 100% 98%     Discharge Labs  BMET  Recent Labs Lab 11/19/14 1410 11/19/14 1649 11/20/14 0521 11/21/14 1236 11/22/14 0405 11/23/14 0410  NA 131* 131* 131* 138 141 142  K 4.7 3.8 3.1* 2.2* 2.7* 3.1*  CL 89* 94* 92* 103 107 111  CO2 26  --  _0 GLUCOSE 121* 126* 127* 95 87 79  BUN 55* 47* 51* 30* 24* 16  CREATININE 1.85* 2.00* 1.49* 0.84 0.65 0.50*  CALCIUM 9.2  --  8.2* 8.0* 8.0* 8.1*  MG 2.0  --  2.1  --  1.8 2.0  PHOS 5.1*  --  5.2*  --  2.2* 2.4*   CBC  Recent Labs Lab 11/20/14 0521 11/22/14 0405 11/23/14 0410  HGB 9.4* 8.7* 8.8*  HCT 29.2* 27.0* 28.4*  WBC 18.7* 9.8 12.1*  PLT 407* 336 326   Anti-Coagulation  Recent Labs Lab 11/19/14 1410 11/20/14 0521 11/21/14 0415 11/22/14 0405 11/23/14 0410  INR 3.46* 4.54* 4.91* 4.86* 4.90*        Discharge Instructions    Call MD for:  difficulty breathing, headache or visual disturbances    Complete by:  As  directed      Call MD for:  extreme fatigue    Complete by:  As directed      Call MD for:  persistant dizziness or light-headedness    Complete by:  As directed      Call MD for:  persistant nausea and vomiting    Complete by:  As directed      Call MD for:  redness, tenderness, or signs of infection (pain, swelling, redness, odor or green/yellow discharge around incision site)    Complete by:  As directed      Call MD for:  severe uncontrolled pain    Complete by:  As directed      Call MD for:  temperature >100.4    Complete by:  As directed      Discharge instructions    Complete by:  As directed   1.  Review medications carefully as they have changed.  2.  See discharge summary for details     Increase activity slowly    Complete by:  As directed              Medication List    STOP taking these medications        amiodarone 200 MG tablet  Commonly known as:  PACERONE     aspirin EC 81 MG tablet     atorvastatin 20 MG tablet  Commonly known as:  LIPITOR     buPROPion 100 MG tablet  Commonly known as:  WELLBUTRIN     dextrose 5 % solution     ergocalciferol 8000 UNIT/ML drops  Commonly known as:  DRISDOL     furosemide 40 MG tablet  Commonly known as:  LASIX     gabapentin 250 MG/5ML solution  Commonly known as:  NEURONTIN     miconazole 2 % cream  Commonly known as:  MICOTIN     MULTIVITAMIN & MINERAL Liqd     nystatin cream  Commonly known as:  MYCOSTATIN     omeprazole 20 MG capsule  Commonly known as:  PRILOSEC     ondansetron 4 MG/5ML solution  Commonly known as:  ZOFRAN     OVER THE COUNTER MEDICATION     oxyCODONE 5 MG/5ML  solution  Commonly known as:  ROXICODONE     VANCOMYCIN HCL PO     warfarin 2 MG tablet  Commonly known as:  COUMADIN     warfarin 2.5 MG tablet  Commonly known as:  COUMADIN      TAKE these medications        budesonide 0.5 MG/2ML nebulizer solution  Commonly known as:  PULMICORT  Take 0.5 mg by  nebulization 2 (two) times daily.     carvedilol 6.25 MG tablet  Commonly known as:  COREG  6.25 mg by PEG Tube route 2 (two) times daily with a meal.     cefTRIAXone 20 MG/ML IVPB  Commonly known as:  ROCEPHIN  Inject 50 mLs (1 g total) into the vein daily.     cetaphil lotion  Apply 1 application topically daily.     chlorhexidine 0.12 % solution  Commonly known as:  PERIDEX  Use as directed 15 mLs in the mouth or throat 2 (two) times daily.     collagenase ointment  Commonly known as:  SANTYL  Apply topically daily. Apply Santyl to yellow areas of buttocks wound Q day, then cover with moist gauze and ABD pad.     ipratropium-albuterol 0.5-2.5 (3) MG/3ML Soln  Commonly known as:  DUONEB  Take 3 mLs by nebulization every 6 (six) hours.     valproate 250 MG/5ML syrup  Commonly known as:  DEPAKENE  Take 250 mg by mouth 2 (two) times daily.        Follow-up Information    Follow up with [facility].   Specialty:  Skilled Nursing Actor information:   [address] [place] [phone]        Disposition:  [facility]   Discharged Condition: Norris Brumbach has met maximum benefit of inpatient care and is medically stable and cleared for discharge.  Patient is pending follow up as above.      Time spent on disposition:  Greater than 35 minutes.   Signed: Noe Gens, NP-C [facility] Pgr: [phone] Office: [phone]

## 2014-11-23 NOTE — Progress Notes (Signed)
[facility] FOR [facility] LAB REPLACEMENT ONLY  The patient does not apply for the [facility] based on the criteria listed below:   1. Is GFR >/= 40 ml/min? Yes.    Patient's GFR today is >60 2. Is urine output >/= 0.5 ml/kg/hr for the last 6 hours? No. Patient's UOP is 0.2 ml/kg/hr 3. Is BUN < 60 mg/dL? Yes.    Patient's BUN today is 16 4. Abnormal electrolyte(s): K+3.1 5. Ordered repletion with: NA 6. If a panic level lab has been reported, has the CCM MD in charge been notified? Yes.  .   Physician:  Boone MasterYacoub  [facility] 11/23/2014 5:13 AM

## 2014-11-23 NOTE — Care Management Note (Signed)
Case Management Note  Patient Details  Name: Chase Bright MRN: [id number] Date of Birth: 07/05/1940  Subjective/Objective:     Patient is eligible for the [facility] level of [facility].  Per physician can go to Ltach level of care once pressors are off.  Contacted daughter, Chase Bright.  Discussed ltach options, and explained that once physician determines stable can go to this level of care.  Daughter wished to go back to [facility] on discharge.  Physician, ltach laison and daughter aware that this may be this weekend.  Will pass on to weekend case manager also.            11-23-14 patient did not go Ltach over w/e but is stable to go today.  [facility] does have bed today.  Plan for transfer today to [facility] at [facility].    Action/Plan:   Expected Discharge Date:                  Expected Discharge Plan:  Long Term Acute Care (LTAC) (admitted from [facility])  In-House Referral:  NA, Clinical Social Work  Discharge planning Services  CM Consult  Post Acute Care Choice:    Choice offered to:     DME Arranged:    DME Agency:     HH Arranged:    HH Agency:     Status of Service:  In process, will continue to follow  Medicare Important Message Given:  Yes Date Medicare IM Given:  11/23/14 Medicare IM give by:  Avie ArenasSarah Kasper Mudrick, RNBSN  Date Additional Medicare IM Given:    Additional Medicare Important Message give by:     If discussed at Long Length of Stay Meetings, dates discussed:    Additional Comments:  Vangie BickerBrown, Johnpatrick Jenny Jane, RN 11/23/2014, 1:51 PM

## 2014-11-23 NOTE — Progress Notes (Signed)
Bilateral upper extremity venous duplex completed:  No obvious evidence of DVT or superficial thrombosis.  Bilateral jugular vein not visualized due to trach.

## 2014-11-25 LAB — CULTURE, BLOOD (ROUTINE X 2)
Culture: NO GROWTH
Culture: NO GROWTH

## 2014-11-25 MED FILL — Valproate Sodium Syrup 250 MG/5ML (Base Equiv): ORAL | Qty: 5 | Status: AC

## 2015-01-21 ENCOUNTER — Inpatient Hospital Stay (HOSPITAL_COMMUNITY): Payer: Medicare Other

## 2015-01-21 ENCOUNTER — Encounter (HOSPITAL_COMMUNITY): Payer: Self-pay | Admitting: Emergency Medicine

## 2015-01-21 ENCOUNTER — Inpatient Hospital Stay (HOSPITAL_COMMUNITY)
Admission: EM | Admit: 2015-01-21 | Discharge: 2015-01-26 | DRG: 870 | Disposition: A | Discharge status: Other Acute Inpatient Hospital | Payer: Medicare Other | Attending: Pulmonary Disease | Admitting: Pulmonary Disease

## 2015-01-21 ENCOUNTER — Emergency Department (HOSPITAL_COMMUNITY): Payer: Medicare Other

## 2015-01-21 DIAGNOSIS — J9611 Chronic respiratory failure with hypoxia: Secondary | ICD-10-CM

## 2015-01-21 DIAGNOSIS — E43 Unspecified severe protein-calorie malnutrition: Secondary | ICD-10-CM | POA: Diagnosis present

## 2015-01-21 DIAGNOSIS — B964 Proteus (mirabilis) (morganii) as the cause of diseases classified elsewhere: Secondary | ICD-10-CM

## 2015-01-21 DIAGNOSIS — J9690 Respiratory failure, unspecified, unspecified whether with hypoxia or hypercapnia: Secondary | ICD-10-CM

## 2015-01-21 DIAGNOSIS — E440 Moderate protein-calorie malnutrition: Secondary | ICD-10-CM

## 2015-01-21 DIAGNOSIS — L89154 Pressure ulcer of sacral region, stage 4: Secondary | ICD-10-CM | POA: Diagnosis not present

## 2015-01-21 DIAGNOSIS — T85628A Displacement of other specified internal prosthetic devices, implants and grafts, initial encounter: Secondary | ICD-10-CM | POA: Diagnosis not present

## 2015-01-21 DIAGNOSIS — I10 Essential (primary) hypertension: Secondary | ICD-10-CM | POA: Diagnosis present

## 2015-01-21 DIAGNOSIS — L8990 Pressure ulcer of unspecified site, unspecified stage: Secondary | ICD-10-CM

## 2015-01-21 DIAGNOSIS — N179 Acute kidney failure, unspecified: Secondary | ICD-10-CM

## 2015-01-21 DIAGNOSIS — Z931 Gastrostomy status: Secondary | ICD-10-CM | POA: Diagnosis not present

## 2015-01-21 DIAGNOSIS — T884XXA Failed or difficult intubation, initial encounter: Secondary | ICD-10-CM | POA: Insufficient documentation

## 2015-01-21 DIAGNOSIS — J9622 Acute and chronic respiratory failure with hypercapnia: Secondary | ICD-10-CM | POA: Diagnosis present

## 2015-01-21 DIAGNOSIS — J189 Pneumonia, unspecified organism: Secondary | ICD-10-CM

## 2015-01-21 DIAGNOSIS — G650 Sequelae of Guillain-Barre syndrome: Secondary | ICD-10-CM | POA: Diagnosis present

## 2015-01-21 DIAGNOSIS — D638 Anemia in other chronic diseases classified elsewhere: Secondary | ICD-10-CM | POA: Diagnosis not present

## 2015-01-21 DIAGNOSIS — Z7901 Long term (current) use of anticoagulants: Secondary | ICD-10-CM

## 2015-01-21 DIAGNOSIS — Z79899 Other long term (current) drug therapy: Secondary | ICD-10-CM

## 2015-01-21 DIAGNOSIS — R0609 Other forms of dyspnea: Secondary | ICD-10-CM | POA: Diagnosis present

## 2015-01-21 DIAGNOSIS — F419 Anxiety disorder, unspecified: Secondary | ICD-10-CM | POA: Diagnosis present

## 2015-01-21 DIAGNOSIS — F319 Bipolar disorder, unspecified: Secondary | ICD-10-CM | POA: Diagnosis present

## 2015-01-21 DIAGNOSIS — Y95 Nosocomial condition: Secondary | ICD-10-CM | POA: Diagnosis present

## 2015-01-21 DIAGNOSIS — B965 Pseudomonas (aeruginosa) (mallei) (pseudomallei) as the cause of diseases classified elsewhere: Secondary | ICD-10-CM | POA: Diagnosis not present

## 2015-01-21 DIAGNOSIS — R739 Hyperglycemia, unspecified: Secondary | ICD-10-CM | POA: Diagnosis not present

## 2015-01-21 DIAGNOSIS — J9600 Acute respiratory failure, unspecified whether with hypoxia or hypercapnia: Secondary | ICD-10-CM | POA: Diagnosis not present

## 2015-01-21 DIAGNOSIS — E876 Hypokalemia: Secondary | ICD-10-CM | POA: Diagnosis present

## 2015-01-21 DIAGNOSIS — R5381 Other malaise: Secondary | ICD-10-CM | POA: Diagnosis not present

## 2015-01-21 DIAGNOSIS — Z9911 Dependence on respirator [ventilator] status: Secondary | ICD-10-CM

## 2015-01-21 DIAGNOSIS — R532 Functional quadriplegia: Secondary | ICD-10-CM | POA: Diagnosis present

## 2015-01-21 DIAGNOSIS — N390 Urinary tract infection, site not specified: Secondary | ICD-10-CM | POA: Diagnosis present

## 2015-01-21 DIAGNOSIS — K567 Ileus, unspecified: Secondary | ICD-10-CM

## 2015-01-21 DIAGNOSIS — Z7951 Long term (current) use of inhaled steroids: Secondary | ICD-10-CM

## 2015-01-21 DIAGNOSIS — G610 Guillain-Barre syndrome: Secondary | ICD-10-CM | POA: Diagnosis not present

## 2015-01-21 DIAGNOSIS — Z930 Tracheostomy status: Secondary | ICD-10-CM | POA: Diagnosis not present

## 2015-01-21 DIAGNOSIS — T426X5A Adverse effect of other antiepileptic and sedative-hypnotic drugs, initial encounter: Secondary | ICD-10-CM | POA: Diagnosis not present

## 2015-01-21 DIAGNOSIS — I4891 Unspecified atrial fibrillation: Secondary | ICD-10-CM | POA: Diagnosis not present

## 2015-01-21 DIAGNOSIS — J9509 Other tracheostomy complication: Secondary | ICD-10-CM | POA: Diagnosis present

## 2015-01-21 DIAGNOSIS — A419 Sepsis, unspecified organism: Secondary | ICD-10-CM | POA: Diagnosis present

## 2015-01-21 DIAGNOSIS — R6521 Severe sepsis with septic shock: Secondary | ICD-10-CM | POA: Diagnosis not present

## 2015-01-21 DIAGNOSIS — Y828 Other medical devices associated with adverse incidents: Secondary | ICD-10-CM | POA: Diagnosis not present

## 2015-01-21 DIAGNOSIS — G9340 Encephalopathy, unspecified: Secondary | ICD-10-CM | POA: Diagnosis present

## 2015-01-21 DIAGNOSIS — R6520 Severe sepsis without septic shock: Secondary | ICD-10-CM | POA: Diagnosis not present

## 2015-01-21 DIAGNOSIS — Z885 Allergy status to narcotic agent status: Secondary | ICD-10-CM | POA: Diagnosis not present

## 2015-01-21 DIAGNOSIS — J9621 Acute and chronic respiratory failure with hypoxia: Secondary | ICD-10-CM | POA: Diagnosis not present

## 2015-01-21 DIAGNOSIS — I952 Hypotension due to drugs: Secondary | ICD-10-CM | POA: Diagnosis not present

## 2015-01-21 LAB — URINALYSIS, ROUTINE W REFLEX MICROSCOPIC
Bilirubin Urine: NEGATIVE
GLUCOSE, UA: NEGATIVE mg/dL
Ketones, ur: NEGATIVE mg/dL
NITRITE: POSITIVE — AB
PH: 5 (ref 5.0–8.0)
PROTEIN: 30 mg/dL — AB
SPECIFIC GRAVITY, URINE: 1.017 (ref 1.005–1.030)
Urobilinogen, UA: 0.2 mg/dL (ref 0.0–1.0)

## 2015-01-21 LAB — COMPREHENSIVE METABOLIC PANEL
ALT: 12 U/L — AB (ref 17–63)
AST: 18 U/L (ref 15–41)
Albumin: 2.7 g/dL — ABNORMAL LOW (ref 3.5–5.0)
Alkaline Phosphatase: 74 U/L (ref 38–126)
Anion gap: 11 (ref 5–15)
BILIRUBIN TOTAL: 0.6 mg/dL (ref 0.3–1.2)
BUN: 41 mg/dL — ABNORMAL HIGH (ref 6–20)
CO2: 24 mmol/L (ref 22–32)
Calcium: 10.5 mg/dL — ABNORMAL HIGH (ref 8.9–10.3)
Chloride: 100 mmol/L — ABNORMAL LOW (ref 101–111)
Creatinine, Ser: 0.51 mg/dL — ABNORMAL LOW (ref 0.61–1.24)
GFR calc Af Amer: >60 mL/min (ref >60)
GLUCOSE: 150 mg/dL — AB (ref 65–99)
Potassium: 4.9 mmol/L (ref 3.5–5.1)
SODIUM: 135 mmol/L (ref 135–145)
TOTAL PROTEIN: 7.1 g/dL (ref 6.5–8.1)

## 2015-01-21 LAB — CBC WITH DIFFERENTIAL/PLATELET
Basophils Absolute: 0 10*3/uL (ref 0.0–0.1)
Basophils Relative: 0 % (ref 0–1)
EOS ABS: 0.1 10*3/uL (ref 0.0–0.7)
Eosinophils Relative: 0 % (ref 0–5)
HCT: 37.2 % — ABNORMAL LOW (ref 39.0–52.0)
Hemoglobin: 11.4 g/dL — ABNORMAL LOW (ref 13.0–17.0)
Lymphocytes Relative: 5 % — ABNORMAL LOW (ref 12–46)
Lymphs Abs: 1 10*3/uL (ref 0.7–4.0)
MCH: 27.5 pg (ref 26.0–34.0)
MCHC: 30.6 g/dL (ref 30.0–36.0)
MCV: 89.6 fL (ref 78.0–100.0)
MONO ABS: 0.8 10*3/uL (ref 0.1–1.0)
MONOS PCT: 4 % (ref 3–12)
Neutro Abs: 16.4 10*3/uL — ABNORMAL HIGH (ref 1.7–7.7)
Neutrophils Relative %: 91 % — ABNORMAL HIGH (ref 43–77)
PLATELETS: 478 10*3/uL — AB (ref 150–400)
RBC: 4.15 MIL/uL — ABNORMAL LOW (ref 4.22–5.81)
RDW: 17.6 % — AB (ref 11.5–15.5)
WBC: 18.3 10*3/uL — AB (ref 4.0–10.5)

## 2015-01-21 LAB — I-STAT ARTERIAL BLOOD GAS, ED
Acid-Base Excess: 3 mmol/L — ABNORMAL HIGH (ref 0.0–2.0)
Bicarbonate: 28.1 mEq/L — ABNORMAL HIGH (ref 20.0–24.0)
O2 SAT: 100 %
TCO2: 29 mmol/L (ref 0–100)
pCO2 arterial: 45.7 mmHg — ABNORMAL HIGH (ref 35.0–45.0)
pH, Arterial: 7.398 (ref 7.350–7.450)
pO2, Arterial: 197 mmHg — ABNORMAL HIGH (ref 80.0–100.0)

## 2015-01-21 LAB — POCT I-STAT 3, ART BLOOD GAS (G3+)
ACID-BASE DEFICIT: 1 mmol/L (ref 0.0–2.0)
BICARBONATE: 23.4 meq/L (ref 20.0–24.0)
O2 Saturation: 98 %
Patient temperature: 98
TCO2: 25 mmol/L (ref 0–100)
pCO2 arterial: 36.9 mmHg (ref 35.0–45.0)
pH, Arterial: 7.408 (ref 7.350–7.450)
pO2, Arterial: 98 mmHg (ref 80.0–100.0)

## 2015-01-21 LAB — TROPONIN I: Troponin I: <0.03 ng/mL (ref <0.031)

## 2015-01-21 LAB — MAGNESIUM
MAGNESIUM: 1.7 mg/dL (ref 1.7–2.4)
Magnesium: 2 mg/dL (ref 1.7–2.4)

## 2015-01-21 LAB — PHOSPHORUS
PHOSPHORUS: 2.5 mg/dL (ref 2.5–4.6)
Phosphorus: 5.4 mg/dL — ABNORMAL HIGH (ref 2.5–4.6)

## 2015-01-21 LAB — URINE MICROSCOPIC-ADD ON

## 2015-01-21 LAB — GLUCOSE, CAPILLARY
GLUCOSE-CAPILLARY: 74 mg/dL (ref 65–99)
Glucose-Capillary: 79 mg/dL (ref 65–99)

## 2015-01-21 LAB — PROCALCITONIN: Procalcitonin: <0.1 ng/mL

## 2015-01-21 LAB — I-STAT CG4 LACTIC ACID, ED: Lactic Acid, Venous: 0.66 mmol/L (ref 0.5–2.0)

## 2015-01-21 LAB — MRSA PCR SCREENING: MRSA BY PCR: POSITIVE — AB

## 2015-01-21 LAB — CORTISOL: CORTISOL PLASMA: 9.8 ug/dL

## 2015-01-21 LAB — PROTIME-INR
INR: 1.93 — ABNORMAL HIGH (ref 0.00–1.49)
PROTHROMBIN TIME: 21.9 s — AB (ref 11.6–15.2)

## 2015-01-21 LAB — BRAIN NATRIURETIC PEPTIDE: B Natriuretic Peptide: 254.1 pg/mL — ABNORMAL HIGH (ref 0.0–100.0)

## 2015-01-21 MED ORDER — PROPOFOL 1000 MG/100ML IV EMUL
5.0000 ug/kg/min | INTRAVENOUS | Status: DC
  Administered 2015-01-21: 20 ug/kg/min via INTRAVENOUS
  Administered 2015-01-21: 10 ug/kg/min via INTRAVENOUS
  Administered 2015-01-22 (×2): 30 ug/kg/min via INTRAVENOUS
  Administered 2015-01-22: 20 ug/kg/min via INTRAVENOUS
  Administered 2015-01-22: 30 ug/kg/min via INTRAVENOUS
  Administered 2015-01-23: 20 ug/kg/min via INTRAVENOUS
  Filled 2015-01-21 (×6): qty 100

## 2015-01-21 MED ORDER — PIPERACILLIN-TAZOBACTAM 3.375 G IVPB 30 MIN
3.3750 g | Freq: Once | INTRAVENOUS | Status: AC
Start: 2015-01-21 — End: 2015-01-21
  Administered 2015-01-21: 3.375 g via INTRAVENOUS

## 2015-01-21 MED ORDER — ANTISEPTIC ORAL RINSE SOLUTION (CORINZ)
7.0000 mL | Freq: Four times a day (QID) | OROMUCOSAL | Status: DC
  Administered 2015-01-21 – 2015-01-26 (×20): 7 mL via OROMUCOSAL

## 2015-01-21 MED ORDER — VITAL HIGH PROTEIN PO LIQD
1000.0000 mL | ORAL | Status: DC
  Filled 2015-01-21 (×2): qty 1000

## 2015-01-21 MED ORDER — PRO-STAT SUGAR FREE PO LIQD
30.0000 mL | Freq: Three times a day (TID) | ORAL | Status: DC
  Administered 2015-01-21 – 2015-01-26 (×15): 30 mL
  Filled 2015-01-21 (×17): qty 30

## 2015-01-21 MED ORDER — VANCOMYCIN HCL IN DEXTROSE 1-5 GM/200ML-% IV SOLN
1000.0000 mg | Freq: Two times a day (BID) | INTRAVENOUS | Status: DC
  Administered 2015-01-21 – 2015-01-23 (×4): 1000 mg via INTRAVENOUS
  Filled 2015-01-21 (×6): qty 200

## 2015-01-21 MED ORDER — SODIUM CHLORIDE 0.9 % IJ SOLN: 10.0000 mL | INTRAMUSCULAR | Status: DC | PRN

## 2015-01-21 MED ORDER — SODIUM CHLORIDE 0.9 % IV BOLUS (SEPSIS)
1000.0000 mL | Freq: Once | INTRAVENOUS | Status: AC
  Administered 2015-01-21: 1000 mL via INTRAVENOUS

## 2015-01-21 MED ORDER — MUPIROCIN 2 % EX OINT
1.0000 "application " | TOPICAL_OINTMENT | Freq: Two times a day (BID) | CUTANEOUS | Status: AC
  Administered 2015-01-21 – 2015-01-25 (×10): 1 via NASAL
  Filled 2015-01-21: qty 22

## 2015-01-21 MED ORDER — BUDESONIDE 0.5 MG/2ML IN SUSP
0.5000 mg | Freq: Two times a day (BID) | RESPIRATORY_TRACT | Status: DC
  Administered 2015-01-21 – 2015-01-26 (×12): 0.5 mg via RESPIRATORY_TRACT
  Filled 2015-01-21: qty 4
  Filled 2015-01-21: qty 2
  Filled 2015-01-21: qty 4
  Filled 2015-01-21 (×2): qty 2
  Filled 2015-01-21: qty 4
  Filled 2015-01-21 (×7): qty 2

## 2015-01-21 MED ORDER — WARFARIN - PHARMACIST DOSING INPATIENT
Freq: Every day | Status: DC
  Administered 2015-01-24: 18:00:00

## 2015-01-21 MED ORDER — PIPERACILLIN-TAZOBACTAM 3.375 G IVPB 30 MIN
3.3750 g | Freq: Three times a day (TID) | INTRAVENOUS | Status: DC
  Filled 2015-01-21: qty 50

## 2015-01-21 MED ORDER — SODIUM CHLORIDE 0.9 % IJ SOLN
10.0000 mL | Freq: Two times a day (BID) | INTRAMUSCULAR | Status: DC
  Administered 2015-01-21 – 2015-01-26 (×9): 10 mL

## 2015-01-21 MED ORDER — WARFARIN SODIUM 2.5 MG PO TABS
2.5000 mg | ORAL_TABLET | Freq: Once | ORAL | Status: AC
  Administered 2015-01-21: 2.5 mg via ORAL
  Filled 2015-01-21: qty 1

## 2015-01-21 MED ORDER — CHLORHEXIDINE GLUCONATE 0.12 % MT SOLN
15.0000 mL | Freq: Two times a day (BID) | OROMUCOSAL | Status: DC
  Administered 2015-01-21 – 2015-01-26 (×11): 15 mL via OROMUCOSAL
  Filled 2015-01-21 (×4): qty 15

## 2015-01-21 MED ORDER — VALPROIC ACID 250 MG/5ML PO SYRP
250.0000 mg | ORAL_SOLUTION | Freq: Two times a day (BID) | ORAL | Status: DC
  Administered 2015-01-21 – 2015-01-23 (×5): 250 mg via ORAL
  Filled 2015-01-21 (×6): qty 5

## 2015-01-21 MED ORDER — PHENYLEPHRINE HCL 10 MG/ML IJ SOLN
30.0000 ug/min | INTRAVENOUS | Status: DC
  Filled 2015-01-21: qty 4

## 2015-01-21 MED ORDER — PANTOPRAZOLE SODIUM 40 MG IV SOLR
40.0000 mg | Freq: Every day | INTRAVENOUS | Status: DC
  Administered 2015-01-21 – 2015-01-25 (×5): 40 mg via INTRAVENOUS
  Filled 2015-01-21 (×6): qty 40

## 2015-01-21 MED ORDER — VITAL AF 1.2 CAL PO LIQD
1000.0000 mL | ORAL | Status: DC
  Administered 2015-01-21 – 2015-01-26 (×6): 1000 mL
  Filled 2015-01-21 (×9): qty 1000

## 2015-01-21 MED ORDER — PIPERACILLIN-TAZOBACTAM 3.375 G IVPB
3.3750 g | Freq: Three times a day (TID) | INTRAVENOUS | Status: DC
  Administered 2015-01-21 – 2015-01-26 (×16): 3.375 g via INTRAVENOUS
  Filled 2015-01-21 (×18): qty 50

## 2015-01-21 MED ORDER — VANCOMYCIN HCL IN DEXTROSE 1-5 GM/200ML-% IV SOLN
1000.0000 mg | Freq: Once | INTRAVENOUS | Status: AC
  Administered 2015-01-21: 1000 mg via INTRAVENOUS
  Filled 2015-01-21: qty 200

## 2015-01-21 MED ORDER — ALBUTEROL SULFATE (2.5 MG/3ML) 0.083% IN NEBU: 2.5000 mg | INHALATION_SOLUTION | RESPIRATORY_TRACT | Status: DC | PRN

## 2015-01-21 MED ORDER — PROPOFOL 1000 MG/100ML IV EMUL
INTRAVENOUS | Status: AC
  Filled 2015-01-21: qty 100

## 2015-01-21 MED ORDER — SODIUM CHLORIDE 0.9 % IV BOLUS (SEPSIS)
1000.0000 mL | INTRAVENOUS | Status: AC
  Administered 2015-01-21 (×2): 1000 mL via INTRAVENOUS

## 2015-01-21 MED ORDER — SODIUM CHLORIDE 0.9 % IV SOLN
INTRAVENOUS | Status: DC
  Administered 2015-01-21 – 2015-01-22 (×4): via INTRAVENOUS

## 2015-01-21 MED ORDER — CHLORHEXIDINE GLUCONATE CLOTH 2 % EX PADS
6.0000 | MEDICATED_PAD | Freq: Every day | CUTANEOUS | Status: AC
  Administered 2015-01-21 – 2015-01-25 (×5): 6 via TOPICAL

## 2015-01-21 MED ORDER — IPRATROPIUM-ALBUTEROL 0.5-2.5 (3) MG/3ML IN SOLN
3.0000 mL | Freq: Four times a day (QID) | RESPIRATORY_TRACT | Status: DC
  Administered 2015-01-21 – 2015-01-26 (×23): 3 mL via RESPIRATORY_TRACT
  Filled 2015-01-21 (×23): qty 3

## 2015-01-21 MED ORDER — SODIUM CHLORIDE 0.9 % IV SOLN: 250.0000 mL | INTRAVENOUS | Status: DC | PRN

## 2015-01-21 NOTE — Care Management Note (Signed)
Case Management Note  Patient Details  Name: Chase Bright MRN: [id number] Date of Birth: [birth year]  Subjective/Objective:        Checked Ltach days - [facility] states can take to [facility] level if discharges on or before this Saturday.      Updated Physician, at this time will probably not be ready for Ltach level by Saturday.    Action/Plan:   Expected Discharge Date:                  Expected Discharge Plan:  Skilled Nursing Facility  In-House Referral:  Clinical Social Work  Discharge planning Services  CM Consult  Post Acute Care Choice:    Choice offered to:     DME Arranged:    DME Agency:     HH Arranged:    HH Agency:     Status of Service:  In process, will continue to follow  Medicare Important Message Given:    Date Medicare IM Given:    Medicare IM give by:    Date Additional Medicare IM Given:    Additional Medicare Important Message give by:     If discussed at Long Length of Stay Meetings, dates discussed:    Additional Comments:  Vangie Bicker, RN 01/21/2015, 2:12 PM

## 2015-01-21 NOTE — ED Provider Notes (Addendum)
CSN: [id number]     Arrival date & time 01/21/15  0143 History  This chart was scribed for Gilda Crease, MD by Evon Slack, ED Scribe. This patient was seen in room [facility] and the patient's care was started at 1:48 AM.    Chief Complaint  Patient presents with  . Respiratory Distress   The history is provided by the EMS personnel. No language interpreter was used.   HPI Comments:Level 5 caveat: Respiratory Distress Chase Bright is a 74 y.o. male brought in by ambulance, who presents to the Emergency Department from [facility] nursing facility for respiratory distress onset tonight. Per EMS pt has been belly breathing. EMS states that the nursing facility reports a low grade fever of 100.9 with no treatment PTA. Pt has a Hx of A-Fib. EMS states that the nursing facility reports Hr up to 130-140. Ems states that they were called due to hypotension and tachycardia    Past Medical History  Diagnosis Date  . Ventilator dependence   . Atrial fibrillation   . Guillain Barr syndrome   . Hypertension   . Anxiety   . Bipolar disorder   . Functional quadriplegia    History reviewed. No pertinent past surgical history. No family history on file. History  Substance Use Topics  . Smoking status: Unknown If Ever Smoked  . Smokeless tobacco: Not on file  . Alcohol Use: Not on file    Review of Systems  Unable to perform ROS: Severe respiratory distress     Allergies  Morphine and related  Home Medications   Prior to Admission medications   Medication Sig Start Date End Date Taking? Authorizing Provider  budesonide (PULMICORT) 0.5 MG/2ML nebulizer solution Take 0.5 mg by nebulization 2 (two) times daily.    Historical Provider, MD  carvedilol (COREG) 6.25 MG tablet 6.25 mg by PEG Tube route 2 (two) times daily with a meal.    Historical Provider, MD  cetaphil (CETAPHIL) lotion Apply 1 application topically daily.    Historical Provider, MD  chlorhexidine (PERIDEX)  0.12 % solution Use as directed 15 mLs in the mouth or throat 2 (two) times daily.    Historical Provider, MD  collagenase (SANTYL) ointment Apply topically daily. Apply Santyl to yellow areas of buttocks wound Q day, then cover with moist gauze and ABD pad. 11/23/14   Jeanella Craze, NP  ipratropium-albuterol (DUONEB) 0.5-2.5 (3) MG/3ML SOLN Take 3 mLs by nebulization every 6 (six) hours.    Historical Provider, MD  valproate (DEPAKENE) 250 MG/5ML syrup Take 250 mg by mouth 2 (two) times daily.    Historical Provider, MD   BP 96/62 mmHg  Pulse 103  Temp(Src) 100 F (37.8 C) (Rectal)  Resp 28  Ht 5\' 8"  (1.727 m)  Wt 190 lb (86.183 kg)  BMI 28.90 kg/m2  SpO2 100%   Physical Exam  Constitutional: He appears well-developed and well-nourished. No distress.  HENT:  Head: Normocephalic and atraumatic.  Right Ear: Hearing normal.  Left Ear: Hearing normal.  Nose: Nose normal.  Mouth/Throat: Oropharynx is clear and moist and mucous membranes are normal.  Eyes: Conjunctivae and EOM are normal. Pupils are equal, round, and reactive to light.  Neck: Normal range of motion. Neck supple.  Cardiovascular: S1 normal and S2 normal.  An irregularly irregular rhythm present. Tachycardia present.  Exam reveals no gallop and no friction rub.   No murmur heard. Pulmonary/Chest: Accessory muscle usage present. Tachypnea noted. He is in respiratory distress. He has rhonchi.  He exhibits no tenderness.  Coarse rhonchi throughout.   Abdominal: Soft. Normal appearance and bowel sounds are normal. There is no hepatosplenomegaly. There is no tenderness. There is no rebound, no guarding, no tenderness at McBurney's point and negative Murphy's sign. No hernia.  Musculoskeletal: Normal range of motion.  Neurological: He is alert. He has normal strength. No cranial nerve deficit or sensory deficit. Coordination normal. GCS eye subscore is 4. GCS verbal subscore is 5. GCS motor subscore is 6.  Skin: Skin is warm, dry  and intact. No rash noted. No cyanosis.  Psychiatric: He has a normal mood and affect. His speech is normal and behavior is normal. Thought content normal.  Nursing note and vitals reviewed.   ED Course  Procedures (including critical care time)  COORDINATION OF CARE:  Labs Review Labs Reviewed  COMPREHENSIVE METABOLIC PANEL - Abnormal; Notable for the following:    Chloride 100 (*)    Glucose, Bld 150 (*)    BUN 41 (*)    Creatinine, Ser 0.51 (*)    Calcium 10.5 (*)    Albumin 2.7 (*)    ALT 12 (*)    All other components within normal limits  CBC WITH DIFFERENTIAL/PLATELET - Abnormal; Notable for the following:    WBC 18.3 (*)    RBC 4.15 (*)    Hemoglobin 11.4 (*)    HCT 37.2 (*)    RDW 17.6 (*)    Platelets 478 (*)    Neutrophils Relative % 91 (*)    Neutro Abs 16.4 (*)    Lymphocytes Relative 5 (*)    All other components within normal limits  BRAIN NATRIURETIC PEPTIDE - Abnormal; Notable for the following:    B Natriuretic Peptide 254.1 (*)    All other components within normal limits  I-STAT ARTERIAL BLOOD GAS, ED - Abnormal; Notable for the following:    pCO2 arterial 45.7 (*)    pO2, Arterial 197.0 (*)    Bicarbonate 28.1 (*)    Acid-Base Excess 3.0 (*)    All other components within normal limits  CULTURE, BLOOD (ROUTINE X 2)  CULTURE, BLOOD (ROUTINE X 2)  URINE CULTURE  TROPONIN I  URINALYSIS, ROUTINE W REFLEX MICROSCOPIC (NOT AT [facility])  I-STAT CG4 LACTIC ACID, ED    Imaging Review Dg Chest Port 1 View  01/21/2015   CLINICAL DATA:  Dyspnea.  EXAM: PORTABLE CHEST - 1 VIEW  COMPARISON:  11/21/2014  FINDINGS: There is a tracheostomy. There is a right jugular central line with tip in the expected location of the SVC just above the azygos vein junction. There is no pneumothorax. There is patchy airspace opacity in the left base which could be infectious or atelectatic. There is no large effusion.  IMPRESSION: Patchy left base opacity, likely infectious or  atelectatic.   Electronically Signed   By: Ellery Plunk M.D.   On: 01/21/2015 02:30     EKG Interpretation   Date/Time:  Thursday January 21 2015 02:00:07 EDT Ventricular Rate:  131 PR Interval:    QRS Duration: 113 QT Interval:  358 QTC Calculation: 528 R Axis:   -174 Text Interpretation:  Atrial fibrillation Ventricular premature complex  Incomplete right bundle branch block Abnormal R-wave progression, late  transition Borderline prolonged QT interval Confirmed by Seynabou Fults  MD,  Roxy Mastandrea [phone]) on 01/21/2015 2:20:19 AM      MDM   Final diagnoses:  None   healthcare associated pneumonia  Atrial fibrillation with rapid ventricular response  Patient  [facility]. Patient is ventilator dependent secondary to Guillain-Barr syndrome. Patient has been declining over the last 24 hours. Patient reportedly had hypotension with a blood pressure in the 50s at the facility. Additionally he was noted to be tachycardic with atrial fibrillation and rapid ventricular response. He does have a history of atrial fibrillation. Patient was febrile, temperature 100.9. A blood gas performed at the facility revealed pH of 7.21 with a PCO2 of 104. Patient was initiated on sepsis protocol at arrival. He was administered vancomycin and Zosyn.  Ultimately, chest x-ray suspicious for possible pneumonia. This would be a cause for sepsis. He was administered IV fluids per sepsis protocol. Patient's heart rate has improved with fluids. He is borderline febrile here in the ER. Blood cultures are pending. Initial blood pressure was normal, he has been borderline hypotensive as well.  Discussed with Dr. Dema Severin, on call for critical care. He agreed with current treatment plan. Patient will be admitted by critical care.  CRITICAL CARE Performed by: Gilda Crease   Total critical care time:  Critical care time was exclusive of separately billable  procedures and treating other patients.  Critical care was necessary to treat or prevent imminent or life-threatening deterioration.  Critical care was time spent personally by me on the following activities: development of treatment plan with patient and/or surrogate as well as nursing, discussions with consultants, evaluation of patient's response to treatment, examination of patient, obtaining history from patient or surrogate, ordering and performing treatments and interventions, ordering and review of laboratory studies, ordering and review of radiographic studies, pulse oximetry and re-evaluation of patient's condition.   I personally performed the services described in this documentation, which was scribed in my presence. The recorded information has been reviewed and is accurate.      Gilda Crease, MD 01/21/15 1610  Gilda Crease, MD 01/21/15 [phone]

## 2015-01-21 NOTE — Progress Notes (Signed)
ANTICOAGULATION CONSULT NOTE - Initial Consult  Pharmacy Consult for Coumadin  Indication: Coumadin   Allergies  Allergen Reactions  . Morphine And Related     Unknown     Patient Measurements: Height:  (172.7 cm) Weight: 190 lb (86.183 kg) IBW/kg (Calculated) : 68.4  Vital Signs: Temp: 98.2 F (36.8 C) (08/04 1230) Temp Source: Rectal (08/04 0220) BP: 122/64 mmHg (08/04 1230) Pulse Rate: 86 (08/04 1230)  Labs:  Recent Labs  01/21/15 0157  HGB 11.4*  HCT 37.2*  PLT 478*  LABPROT 21.9*  INR 1.93*  CREATININE 0.51*  TROPONINI <0.03    Estimated Creatinine Clearance: 87.8 mL/min (by C-G formula based on Cr of 0.51).   Medical History: Past Medical History  Diagnosis Date  . Ventilator dependence   . Atrial fibrillation   . Guillain Barr syndrome   . Hypertension   . Anxiety   . Bipolar disorder   . Functional quadriplegia     Medications:  Prescriptions prior to admission  Medication Sig Dispense Refill Last Dose  . budesonide (PULMICORT) 0.5 MG/2ML nebulizer solution Take 0.5 mg by nebulization 2 (two) times daily.   11/19/2014 at Unknown time  . carvedilol (COREG) 6.25 MG tablet 6.25 mg by PEG Tube route 2 (two) times daily with a meal.   11/19/2014 at 800  . cetaphil (CETAPHIL) lotion Apply 1 application topically daily.   11/17/2014  . chlorhexidine (PERIDEX) 0.12 % solution Use as directed 15 mLs in the mouth or throat 2 (two) times daily.   11/19/2014 at Unknown time  . collagenase (SANTYL) ointment Apply topically daily. Apply Santyl to yellow areas of buttocks wound Q day, then cover with moist gauze and ABD pad. 15 g 0   . ipratropium-albuterol (DUONEB) 0.5-2.5 (3) MG/3ML SOLN Take 3 mLs by nebulization every 6 (six) hours.   11/19/2014 at Unknown time  . valproate (DEPAKENE) 250 MG/5ML syrup Take 250 mg by mouth 2 (two) times daily.   11/08/2014    Assessment: 15 YOM admitted with respiratory distress on Coumadin prior to admission for Afib. INR  today is sub-therapeutic at 1.93. H/H mildly low, Plt high at 478. Pharmacy consulted to resume Coumadin   PTA Coumadin dose: 2.5 mg daily   Goal of Therapy:  INR 2-3 Monitor platelets by anticoagulation protocol: Yes   Plan:  -Coumadin 2.5 mg today.  -Monitor daily PT/INR   Vinnie Level, PharmD., BCPS Clinical Pharmacist Pager [phone]

## 2015-01-21 NOTE — Procedures (Signed)
Bronchoscopy Procedure Note Hanzel Pizzo [id number] 04-May-1941  Procedure: Bronchoscopy Indications: Diagnostic evaluation of the airways  Procedure Details Consent: Unable to obtain consent because of emergent medical necessity. Time Out: Verified patient identification, verified procedure, site/side was marked, verified correct patient position, special equipment/implants available, medications/allergies/relevent history reviewed, required imaging and test results available.  Performed  In preparation for procedure, patient was given 100% FiO2 and bronchoscope lubricated. Sedation: propofol  Airway entered and the following bronchi were examined: RUL, RML, RLL, LUL, LLL and Bronchi.   Procedures performed: Brushings performed- none Bronchoscope removed.    Evaluation Hemodynamic Status: BP stable throughout; O2 sats: stable throughout Patient's Current Condition: stable Specimens:  Sent serosanguinous fluid Complications: No apparent complications Patient did tolerate procedure well.   Nelda Bucks. 01/21/2015  Done for sats 53% , r/o plug 1. Thin frothy secretions RML, removed, BAL 2. Janina Mayo exists and has granulation/ prominent posterior wall obstruction 60%   Needs longer trach eventually  Mcarthur Rossetti. Tyson Alias, MD, FACP Pgr: [phone] [facility]

## 2015-01-21 NOTE — Progress Notes (Signed)
ANTIBIOTIC CONSULT NOTE - INITIAL  Pharmacy Consult for Vancomycin/Zosyn  Indication: rule out sepsis  Allergies  Allergen Reactions  . Morphine And Related     Unknown    Patient Measurements: Height:  (172.7 cm) Weight: 190 lb (86.183 kg) IBW/kg (Calculated) : 68.4  Vital Signs: Temp: 100 F (37.8 C) (08/04 0220) Temp Source: Rectal (08/04 0220) BP: 118/79 mmHg (08/04 0400) Pulse Rate: 94 (08/04 0400)  Labs:  Recent Labs  01/21/15 0157  WBC 18.3*  HGB 11.4*  PLT 478*  CREATININE 0.51*   Estimated Creatinine Clearance: 87.8 mL/min (by C-G formula based on Cr of 0.51).  Medical History: Past Medical History  Diagnosis Date  . Ventilator dependence   . Atrial fibrillation   . Guillain Barr syndrome   . Hypertension   . Anxiety   . Bipolar disorder   . Functional quadriplegia    Assessment: Recent admission in June 2016 for pan-sensitive Klebsiella PNA and proteus UTI. Back with respiratory distress, febrile, hypotensive, elevated WBC. Other labs reviewed.   Goal of Therapy:  Vancomycin trough level 15-20 mcg/ml  Plan:  -Vancomycin 1000 mg IV q12h -Zosyn 3.375G IV q8h to be infused over 4 hours -Trend WBC, temp, renal function -Drug levels at steady state  Abran Duke 01/21/2015,4:21 AM

## 2015-01-21 NOTE — Progress Notes (Signed)
Pt. Being seen for trach consult. No education at this time, all equipment at bedside

## 2015-01-21 NOTE — ED Notes (Signed)
Second set of blood cultures collected °

## 2015-01-21 NOTE — ED Notes (Signed)
Pt in from [facility]. Facility stated pt was having SOB and fighting vent. Stated that BP 59/30, HR 47, temp 100.9. En route [facility] vitals of 115/60,HR 80, temp 99. Pt had ABG drawn at 1030pm. Pt condition has deteriorated en route, SOB, fighting vent, seesaw breathing. Pt given  ativan en route. Denies pain. Hx afib

## 2015-01-21 NOTE — Trach Care Team (Signed)
Trach Care Progression Note   Patient Details Name: Chase Bright MRN: [id number] DOB: 1940/11/03 Today's Date: 01/21/2015   Tracheostomy Assessment    Tracheostomy Shiley 6 mm Cuffed (Active)  Status Secured 01/21/2015 11:56 AM  Site Assessment Dry 01/21/2015 11:52 AM  Site Care Dried 01/21/2015 11:56 AM  Inner Cannula Care Changed/new 01/21/2015  5:00 AM  Ties Assessment Clean;Dry;Secure 01/21/2015 11:56 AM  Cuff pressure (cm) 28 cm 01/21/2015 11:52 AM  Emergency Equipment at bedside Yes 01/21/2015 11:56 AM     Care Needs     Respiratory Therapy Tracheostomy: Chronic trach O2 Device: Ventilator FiO2 (%): 30 % SpO2: 100 % Education: Not applicable Follow up recommendations:  (will continue to follow for progression) Respiratory barriers to progression: Other (comment) (vent dependent)    Speech Language Pathology  SLP chart review complete: Patient not ready for SLP services   Physical Therapy      Occupational Therapy      Nutritional Patient's Current Diet: NPO    Case Management/Social Work      Theatre manager Care Team/Provider Recommendations [place] Care Team Members Present-  Tammie Reading, RT, Shon Baton, SW    Chronic Trach. From [facility]. Continue to follow.          Errol Ala, Silva Bandy (Scribe for team) 01/21/2015, 2:36 PM

## 2015-01-21 NOTE — Consult Note (Addendum)
WOC wound consult note Reason for Consult: Consult requested to assess wounds.  Pt is familiar to [facility] team from previous admission, refer to consult on 6/3.  Sacral wound has improved since that time. Wound type: Sacrum with stage 4 pressure injury; 3X3X1cm with tunneling at 12:00 o'clock to 1 cm.  Bone palpable with swab, wound bed beefy red. Bilat heels and ankles have red skin which is blanchable. Pressure Ulcer POA: Yes Drainage (amount, consistency, odor) Mod amt yellow drainage, no odor. Periwound: Pink dry scar tissue surrounding wound. Dressing procedure/placement/frequency: Sacral wound does not appear infected, but if source of sepsis is unknown, then consider MRI to R/O osteomyelitis since there is exposed bone. Aquacel to absorb drainage and provide antimicrobial benefits.  Pt is on a Sport low-airloss bed to reduce pressure.  He is not very alert and there is no family present to discuss plan of care. Please re-consult if further assistance is needed.  Thank-you,  Cammie Mcgee MSN, RN, CWOCN, Bright, CNS [phone]

## 2015-01-21 NOTE — Progress Notes (Signed)
Initial Nutrition Assessment  INTERVENTION:   Initiate TF via PEG with Vital AF 1.2 at 25 ml/h and Pro-stat 30 ml TID on day 1; on day 2, increase to goal rate of 50 ml/h (1200 ml per day) to provide 1740 kcals, 135 gm protein, 972 ml free water daily. TF plus propofol will provide 1864 kcal (99% of estimated needs)    NUTRITION DIAGNOSIS:   Inadequate oral intake related to inability to eat as evidenced by NPO status.   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 In ED was tachycardic (AF RVR), hypotensive, and febrile with findings concerning for HCAP.  Pt is trach/PEG dependent. Stage IV sacral pressure ulcer, stage I pressure ulcers on right and left ankles, and stage I pressure ulcer on right and left buttocks. Possible severe sepsis per MD note.   Patient is currently intubated on ventilator support MV: 10.8 L/min Temp (24hrs), Avg:98.7 F (37.1 C), Min:97.9 F (36.6 C), Max:100 F (37.8 C)  Propofol: 4.7 ml/hr (Provides 124 kcal per 24 hours)   Diet Order:  Diet NPO time specified  Skin:    Stage IV sacral pressure ulcer, stage I pressure ulcers on right and left ankles, and stage I pressure ulcer on right and left buttocks.   Last BM:  PTA  Height:   Ht Readings from Last 1 Encounters:  01/21/15  (1.727 m)    Weight:   Wt Readings from Last 1 Encounters:  01/21/15 190 lb (86.183 kg)    Ideal Body Weight:  70 kg  BMI:  Body mass index is 28.9 kg/(m^2).  Estimated Nutritional Needs:   Kcal:  1890  Protein:  130-145 grams  Fluid:  2.5 L/day  EDUCATION NEEDS:   No education needs identified at this time  Dorothea Ogle RD, LDN Inpatient Clinical  Dietitian Pager: [phone] After Hours Pager: [phone]

## 2015-01-21 NOTE — H&P (Signed)
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 In ED was tachycardic (AF RVR), hypotensive, and febrile with findings concerning for HCAP. PCCM to admit.   STUDIES:    SIGNIFICANT EVENTS: 8/4 admit  HISTORY OF PRESENT ILLNESS:  74 year old male [facility] resident (from [place], [place]), with a PMH of atrial fibrillation on chronic coumadin, HTN, Anxiety, Bipolar Disorder, chronic trach / PEG in the setting of Guillain Barre syndrome (did not respond to plasma phoresis, developed in 07/2014, treated at [facility]). He was recently admitted to [facility] for bowel obstruction, hospital course complicated by klebsiella PNA and proteus UTI. He transiently required vasopressors. He was ultimately discharged to [facility] on antibiotics 6/6. 8/4 he again presented to [facility] ED. [facility] staff reports that patient has been declining over the past 24 hours and has been hypotensive with BP in 50's. He was also noted to be in AF with RVR and febrile. EMS was called and upon their arrival, patient appeared to be in respiratory distress despite being on ventilator. ABG there showed pH 7.21 with CO2 104. In ED he was initiated on sepsis protocol and started on broad spectrum antibiotics, CXR suspicious for pneumonia. Tachycardia has somewhat improved with IVF. PCCM called for admission.   PAST MEDICAL HISTORY :   has a past medical history of Ventilator dependence; Atrial fibrillation; Guillain Barr syndrome; Hypertension; Anxiety; Bipolar disorder; and Functional quadriplegia.  has no past surgical history on file. Prior to Admission medications   Medication Sig Start Date End Date Taking? Authorizing Provider  budesonide  (PULMICORT) 0.5 MG/2ML nebulizer solution Take 0.5 mg by nebulization 2 (two) times daily.    Historical Provider, MD  carvedilol (COREG) 6.25 MG tablet 6.25 mg by PEG Tube route 2 (two) times daily with a meal.    Historical Provider, MD  cetaphil (CETAPHIL) lotion Apply 1 application topically daily.    Historical Provider, MD  chlorhexidine (PERIDEX) 0.12 % solution Use as directed 15 mLs in the mouth or throat 2 (two) times daily.    Historical Provider, MD  collagenase (SANTYL) ointment Apply topically daily. Apply Santyl to yellow areas of buttocks wound Q day, then cover with moist gauze and ABD pad. 11/23/14   Jeanella Craze, NP  ipratropium-albuterol (DUONEB) 0.5-2.5 (3) MG/3ML SOLN Take 3 mLs by nebulization every 6 (six) hours.    Historical Provider, MD  valproate (DEPAKENE) 250 MG/5ML syrup Take 250 mg by mouth 2 (two) times daily.    Historical Provider, MD   Allergies  Allergen Reactions  . Morphine And Related     Unknown     FAMILY HISTORY:  has no family status information on file.  SOCIAL HISTORY:    REVIEW OF SYSTEMS:  unable  SUBJECTIVE:   VITAL SIGNS: Temp:  [99 F (37.2 C)-100 F (37.8 C)] 100 F (37.8 C) (08/04 0220) Pulse Rate:  [55-114] 103 (08/04 0300) Resp:  [28-34] 28 (08/04 0300) BP: (96-128)/(53-87) 96/62 mmHg (08/04 0300) SpO2:  [97 %-100 %] 100 % (08/04 0300) Weight:  [86.183 kg (190 lb)] 86.183 kg (190 lb) (08/04 0250) HEMODYNAMICS:   VENTILATOR SETTINGS:   INTAKE / OUTPUT: No intake or output data in the 24 hours ending 01/21/15  1610  PHYSICAL EXAMINATION: General:  Chronically ill appearing male in NAD Neuro:  Arouses to verbal, follows simple commands HEENT:  [place]/AT, trach in place, PERRL, no JVD noted Cardiovascular:  Tachy (110) IRIR, no MRG noted Lungs:  Clear bilateral lung sounds Abdomen:  Soft, non-tender, mild distension  Musculoskeletal:  No acute deformity Skin:  Sacral wound  LABS:  CBC  Recent Labs Lab 01/21/15 0157   WBC 18.3*  HGB 11.4*  HCT 37.2*  PLT 478*   Coag's No results for input(s): APTT, INR in the last 168 hours. BMET  Recent Labs Lab 01/21/15 0157  NA 135  K 4.9  CL 100*  CO2 24  BUN 41*  CREATININE 0.51*  GLUCOSE 150*   Electrolytes  Recent Labs Lab 01/21/15 0157  CALCIUM 10.5*   Sepsis Markers  Recent Labs Lab 01/21/15 0215  LATICACIDVEN 0.66   ABG  Recent Labs Lab 01/21/15 0205  PHART 7.398  PCO2ART 45.7*  PO2ART 197.0*   Liver Enzymes  Recent Labs Lab 01/21/15 0157  AST 18  ALT 12*  ALKPHOS 74  BILITOT 0.6  ALBUMIN 2.7*   Cardiac Enzymes  Recent Labs Lab 01/21/15 0157  TROPONINI <0.03   Glucose No results for input(s): GLUCAP in the last 168 hours.  Imaging Dg Chest Port 1 View  01/21/2015   CLINICAL DATA:  Dyspnea.  EXAM: PORTABLE CHEST - 1 VIEW  COMPARISON:  11/21/2014  FINDINGS: There is a tracheostomy. There is a right jugular central line with tip in the expected location of the SVC just above the azygos vein junction. There is no pneumothorax. There is patchy airspace opacity in the left base which could be infectious or atelectatic. There is no large effusion.  IMPRESSION: Patchy left base opacity, likely infectious or atelectatic.   Electronically Signed   By: Ellery Plunk M.D.   On: 01/21/2015 02:30     ASSESSMENT / PLAN:  PULMONARY Trach from OSH >>> A: VDRF from GBS Acute hypercarbic respiratory failure ? HCAP  P:   Full vent support Follow ABG CXR in AM VAP bundle Home nebs  CARDIOVASCULAR CVL R subclavian from OSH >>> A:  Atrial fibrillation with RVR > improved with volume Hypotension, severe sepsis vs hypovolemia H/o HTN  P:  Telemetry monitoring Ensure 30cc/kg volume resuscitation CVP monitoring Lactic, troponin normal Holding coreg  RENAL A:   Hypercalcemia  P:   Follow Bmet IVF resuscitation  GASTROINTESTINAL A:   Protein calorie malnutrition  P:   Tube feeds per  dietary Protonix for SUP  HEMATOLOGIC A:   Anemia chronic Coumadin for AF  P:  Follow CBC Check PT INR Hold coumadin until coags result.   INFECTIOUS A:   ? Severe sepsis, most likely UTI. HCAP, Line, wound infection are also possible etiologies Recent klebsiella PNA (amp resistant only) Recent Proteus UTI (macrobid resistant only)   P:   BCx2 8/4 >>> UC 8/4 >>> Sputum 8/4 >>> Abx: Zosyn, start date 8/4 Abx: Vancomycin, start date 8/4, day 8/4 PCT  May need CVL exchanged Consult wound care in AM  ENDOCRINE A:   Hyperglycemia without history of DM  P:   Follow glucose on chemistry Add SSI if consistently greater than 180 Check cortisol  NEUROLOGIC A:   Encephalopathy, unclear what baseline is.  Quadriplegia Bipolar disorder  P:   RASS goal: 0 Continue home depakote Monitor   FAMILY  - Updates:   - Inter-disciplinary family meet or Palliative Care meeting due by:  8/11   Joneen Roach, AGACNP-BC [facility] Pulmonology/Critical Care Pager [phone] or [phone]  01/21/2015 3:59 AM

## 2015-01-21 NOTE — Progress Notes (Signed)
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 In ED was tachycardic (AF RVR), hypotensive, and febrile with findings concerning for HCAP. PCCM to admit.   STUDIES:    SIGNIFICANT EVENTS: 8/4 admit 8/4- severe dest, emergent bronch - mucous RML, granulation obstruction posterior wall  SUBJECTIVE: agitated  VITAL SIGNS: Temp:  [97.9 F (36.6 C)-100 F (37.8 C)] 98.2 F (36.8 C) (08/04 1230) Pulse Rate:  [55-114] 86 (08/04 1230) Resp:  [18-34] 20 (08/04 1230) BP: (61-143)/(43-90) 122/64 mmHg (08/04 1230) SpO2:  [97 %-100 %] 100 % (08/04 1215) FiO2 (%):  [30 %] 30 % (08/04 1156) Weight:  [86.183 kg (190 lb)] 86.183 kg (190 lb) (08/04 0250) HEMODYNAMICS:   VENTILATOR SETTINGS: Vent Mode:  [-] PRVC FiO2 (%):  [30 %] 30 % Set Rate:  [20 bmp-28 bmp] 20 bmp Vt Set:  [500 mL] 500 mL PEEP:  [5 cmH20] 5 cmH20 Plateau Pressure:  [14 cmH20-19 cmH20] 19 cmH20 INTAKE / OUTPUT:  Intake/Output Summary (Last 24 hours) at 01/21/15 1253 Last data filed at 01/21/15 1200  Gross per 24 hour  Intake   3600 ml  Output    730 ml  Net   2870 ml    PHYSICAL EXAMINATION: General:  Chronically ill appearing male in NAD Neuro:  Arouses to verbal, follows simple commands HEENT:  Trach clean Cardiovascular:  s1 s2 RIR Lungs:  coarse Abdomen:  Soft, non-tender, mild distension  Musculoskeletal:  No acute deformity Skin:  Sacral wound  LABS:  CBC  Recent Labs Lab 01/21/15 0157  WBC 18.3*  HGB 11.4*  HCT 37.2*  PLT 478*   Coag's  Recent Labs Lab 01/21/15 0157  INR 1.93*   BMET  Recent Labs Lab 01/21/15 0157  NA 135  K 4.9  CL 100*  CO2 24  BUN 41*  CREATININE 0.51*  GLUCOSE 150*    Electrolytes  Recent Labs Lab 01/21/15 0157  CALCIUM 10.5*  MG 2.0  PHOS 5.4*   Sepsis Markers  Recent Labs Lab 01/21/15 0157 01/21/15 0215  LATICACIDVEN  --  0.66  PROCALCITON <0.10  --    ABG  Recent Labs Lab 01/21/15 0205 01/21/15 0602  PHART 7.398 7.408  PCO2ART 45.7* 36.9  PO2ART 197.0* 98.0   Liver Enzymes  Recent Labs Lab 01/21/15 0157  AST 18  ALT 12*  ALKPHOS 74  BILITOT 0.6  ALBUMIN 2.7*   Cardiac Enzymes  Recent Labs Lab 01/21/15 0157  TROPONINI <0.03   Glucose No results for input(s): GLUCAP in the last 168 hours.  Imaging Dg Chest Port 1 View  01/21/2015   CLINICAL DATA:  Dyspnea.  EXAM: PORTABLE CHEST - 1 VIEW  COMPARISON:  11/21/2014  FINDINGS: There is a tracheostomy. There is a right jugular central line with tip in the expected location of the SVC just above the azygos vein junction. There is no pneumothorax. There is patchy airspace opacity in the left base which could be infectious or atelectatic. There is no large effusion.  IMPRESSION: Patchy left base opacity, likely infectious or atelectatic.   Electronically Signed   By: Ellery Plunk M.D.   On: 01/21/2015 02:30     ASSESSMENT / PLAN:  PULMONARY Trach from OSH >>>  A: VDRF from GBS Acute hypercarbic respiratory failure ? HCAP  P:   Emergent bronch done Will consider brovana longer trach May need ENT Keep same MV pcxr now  CARDIOVASCULAR CVL R subclavian from OSH >>> A:  Atrial fibrillation with RVR > improved with volume Hypotension, severe sepsis vs hypovolemia H/o HTN  P:  Telemetry monitoring Allow pos balance CVP awaited changing picc, dc line source? Lactic unimpressive Holding coreg  RENAL A:   Hypercalcemia  P:   Follow Bmet IVF resuscitation on going No pressors needed thus far  GASTROINTESTINAL A:   Protein calorie malnutrition  P:   Tube feeds per dietary, PEG Protonix for SUP  HEMATOLOGIC A:   Anemia chronic Coumadin for  AF  P:  Follow CBC Check PT INR - 1.9 Coumadin continued  INFECTIOUS A:   ? Severe sepsis, most likely UTI. HCAP, Line, wound infection are also possible etiologies Recent klebsiella PNA (amp resistant only) Recent Proteus UTI (macrobid resistant only)   P:   BCx2 8/4 >>> UC 8/4 >>> Sputum 8/4 >>> Abx: Zosyn, start date 8/4 Abx: Vancomycin, start date 8/4, day 8/4  May need CVL exchanged, done picc added, dc line Consult wound care in AM Keep same regimen Foley new  ENDOCRINE A:   Hyperglycemia without history of DM  P:   Follow glucose on chemistry Add SSI if consistently greater than 180 Check cortisol- low, add stress roids if drops BP  NEUROLOGIC A:   Encephalopathy, unclear what baseline is.  Quadriplegia Bipolar disorder Vent dyscrhony P:   RASS goal: 0 Continue home depakote Monitor Add propofol  FAMILY  - Updates:   - Inter-disciplinary family meet or Palliative Care meeting due by:  8/11   Ccm time 45 min   Mcarthur Rossetti. Tyson Alias, MD, FACP Pgr: [phone] [place] [facility]

## 2015-01-22 ENCOUNTER — Inpatient Hospital Stay (HOSPITAL_COMMUNITY): Payer: Medicare Other

## 2015-01-22 DIAGNOSIS — T884XXA Failed or difficult intubation, initial encounter: Secondary | ICD-10-CM

## 2015-01-22 LAB — PHOSPHORUS
PHOSPHORUS: 2.1 mg/dL — AB (ref 2.5–4.6)
Phosphorus: 3.5 mg/dL (ref 2.5–4.6)

## 2015-01-22 LAB — VALPROIC ACID LEVEL: Valproic Acid Lvl: 22 ug/mL — ABNORMAL LOW (ref 50.0–100.0)

## 2015-01-22 LAB — MAGNESIUM
MAGNESIUM: 1.7 mg/dL (ref 1.7–2.4)
Magnesium: 2.8 mg/dL — ABNORMAL HIGH (ref 1.7–2.4)

## 2015-01-22 LAB — GLUCOSE, CAPILLARY
GLUCOSE-CAPILLARY: 99 mg/dL (ref 65–99)
GLUCOSE-CAPILLARY: 99 mg/dL (ref 65–99)
GLUCOSE-CAPILLARY: 106 mg/dL — AB (ref 65–99)
Glucose-Capillary: 78 mg/dL (ref 65–99)
Glucose-Capillary: 80 mg/dL (ref 65–99)
Glucose-Capillary: 88 mg/dL (ref 65–99)

## 2015-01-22 LAB — BASIC METABOLIC PANEL
ANION GAP: 5 (ref 5–15)
BUN: 23 mg/dL — AB (ref 6–20)
CALCIUM: 9 mg/dL (ref 8.9–10.3)
CO2: 24 mmol/L (ref 22–32)
Chloride: 115 mmol/L — ABNORMAL HIGH (ref 101–111)
Creatinine, Ser: 0.38 mg/dL — ABNORMAL LOW (ref 0.61–1.24)
GFR calc Af Amer: >60 mL/min (ref >60)
GFR calc non Af Amer: >60 mL/min (ref >60)
Glucose, Bld: 114 mg/dL — ABNORMAL HIGH (ref 65–99)
Potassium: 3 mmol/L — ABNORMAL LOW (ref 3.5–5.1)
Sodium: 144 mmol/L (ref 135–145)

## 2015-01-22 LAB — PROTIME-INR
INR: 2.73 — AB (ref 0.00–1.49)
Prothrombin Time: 28.5 seconds — ABNORMAL HIGH (ref 11.6–15.2)

## 2015-01-22 MED ORDER — WARFARIN SODIUM 1 MG PO TABS
1.0000 mg | ORAL_TABLET | Freq: Once | ORAL | Status: AC
  Administered 2015-01-22: 1 mg via ORAL
  Filled 2015-01-22: qty 1

## 2015-01-22 MED ORDER — MAGNESIUM SULFATE 2 GM/50ML IV SOLN
2.0000 g | Freq: Once | INTRAVENOUS | Status: AC
  Administered 2015-01-22: 2 g via INTRAVENOUS
  Filled 2015-01-22: qty 50

## 2015-01-22 MED ORDER — POTASSIUM CHLORIDE 20 MEQ/15ML (10%) PO SOLN
40.0000 meq | ORAL | Status: AC
Start: 2015-01-22 — End: 2015-01-22
  Administered 2015-01-22 (×2): 40 meq
  Filled 2015-01-22 (×2): qty 30

## 2015-01-22 MED FILL — Lorazepam Inj 2 MG/ML: INTRAMUSCULAR | Qty: 1 | Status: AC

## 2015-01-22 NOTE — Progress Notes (Signed)
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 In ED was tachycardic (AF RVR), hypotensive, and febrile with findings concerning for HCAP. PCCM to admit.   STUDIES:    SIGNIFICANT EVENTS: 8/4 admit 8/4- severe dest, emergent bronch - mucous RML, granulation obstruction posterior wall  SUBJECTIVE: calm, no desats overnight  VITAL SIGNS: Temp:  [97.9 F (36.6 C)-99.4 F (37.4 C)] 98.6 F (37 C) (08/05 1000) Pulse Rate:  [32-90] 85 (08/05 1000) Resp:  [14-35] 22 (08/05 1000) BP: (88-220)/(50-168) 144/83 mmHg (08/05 1000) SpO2:  [97 %-100 %] 100 % (08/05 1000) FiO2 (%):  [30 %-40 %] 40 % (08/05 0800) Weight:  [79.3 kg (174 lb 13.2 oz)] 79.3 kg (174 lb 13.2 oz) (08/05 0304) HEMODYNAMICS: CVP:  [7 mmHg-8 mmHg] 8 mmHg VENTILATOR SETTINGS: Vent Mode:  [-] PRVC FiO2 (%):  [30 %-40 %] 40 % Set Rate:  [20 bmp] 20 bmp Vt Set:  [500 mL-580 mL] 580 mL PEEP:  [5 cmH20] 5 cmH20 Plateau Pressure:  [19 cmH20-25 cmH20] 25 cmH20 INTAKE / OUTPUT:  Intake/Output Summary (Last 24 hours) at 01/22/15 1051 Last data filed at 01/22/15 1000  Gross per 24 hour  Intake 3643.34 ml  Output   2200 ml  Net 1443.34 ml    PHYSICAL EXAMINATION: General:  Chronically ill appearing male in NAD Neuro:  Arouses to verbal, follows simple commands HEENT:  Trach clean Cardiovascular:  s1 s2 RIR Lungs:  Coarse improved Abdomen:  Soft, non-tender, mild distension  Musculoskeletal:  No acute deformity Skin:  Sacral wound  LABS:  CBC  Recent Labs Lab 01/21/15 0157  WBC 18.3*  HGB 11.4*  HCT 37.2*  PLT 478*   Coag's  Recent Labs Lab 01/21/15 0157 01/22/15 0224  INR 1.93* 2.73*   BMET  Recent Labs Lab  01/21/15 0157  NA 135  K 4.9  CL 100*  CO2 24  BUN 41*  CREATININE 0.51*  GLUCOSE 150*   Electrolytes  Recent Labs Lab 01/21/15 0157 01/21/15 1520 01/22/15 0200  CALCIUM 10.5*  --   --   MG 2.0 1.7 1.7  PHOS 5.4* 2.5 2.1*   Sepsis Markers  Recent Labs Lab 01/21/15 0157 01/21/15 0215  LATICACIDVEN  --  0.66  PROCALCITON <0.10  --    ABG  Recent Labs Lab 01/21/15 0205 01/21/15 0602  PHART 7.398 7.408  PCO2ART 45.7* 36.9  PO2ART 197.0* 98.0   Liver Enzymes  Recent Labs Lab 01/21/15 0157  AST 18  ALT 12*  ALKPHOS 74  BILITOT 0.6  ALBUMIN 2.7*   Cardiac Enzymes  Recent Labs Lab 01/21/15 0157  TROPONINI <0.03   Glucose  Recent Labs Lab 01/21/15 1525 01/21/15 2016 01/22/15 0023 01/22/15 0328 01/22/15 0837  GLUCAP 79 74 78 99 80    Imaging Dg Chest Port 1 View  01/22/2015   CLINICAL DATA:  74 year old male with ventilator dependence. Initial encounter.  EXAM: PORTABLE CHEST - 1 VIEW  COMPARISON:  01/21/2015 and earlier.  FINDINGS: Portable AP semi upright view at 0509 hrs. Tracheostomy tube. Right IJ central line is been removed. Right side PICC line. Stable lung volumes. Stable cardiac size and mediastinal contours. Less kyphotic view. Ventilation at the lung bases appears stable to  mildly improved since 11/20/2014. No pneumothorax or pulmonary edema. No consolidation or pleural effusion. Mild patchy infrahilar opacity.  IMPRESSION: 1. Right IJ central line removed. Otherwise, stable lines and tubes. 2. Mild patchy lung base opacity most resembles atelectasis. No other acute cardiopulmonary abnormality.   Electronically Signed   By: Odessa Fleming M.D.   On: 01/22/2015 07:33   Dg Chest Port 1 View  01/21/2015   CLINICAL DATA:  Central line placement.  EXAM: PORTABLE CHEST - 1 VIEW  COMPARISON:  01/21/2015 at 0218 hours  FINDINGS: Tracheostomy tube overlies the airway. The tip of a presumably new right-sided central venous catheter terminates over the lower  SVC. The more proximal portion of the catheter is suboptimally visualized as the patient's chin projects over the right lung apex and clavicle. There is a second curvilinear structure projecting over the medial clavicle and upper right mediastinum. By report from the ICU nurse, only a single right IJ catheter is currently in place, and this additional tubing may be external to the patient.  The cardiomediastinal silhouette is grossly unchanged. There are mild opacities in the lung bases, left greater than right, not significantly changed on the left and new on the right. No sizable pleural effusion or pneumothorax is identified, although again the right lung apex is largely obscured.  IMPRESSION: 1. Right-sided central venous catheter terminates over the lower SVC, with evaluation of the more proximal catheter limited by superimposition of the patient's chin. Possible additional external tubing as above. 2. Mild left greater than right basilar lung opacities, atelectasis versus infection.   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 HCAP  P:   Will consider brovana longer trach to bypass the granulation today as settings stable Dilation and ablation typically NOT successful Keep same MV  CARDIOVASCULAR CVL R subclavian from OSH >>> A:  Atrial fibrillation with RVR > improved with volume Hypotension, severe sepsis vs hypovolemia - improved H/o HTN  P:  Telemetry monitoring Allow pos balance Holding coreg, may need in am   RENAL A:   Hpomag Hpophos  P:   Follow Bmet for this am kvo  GASTROINTESTINAL A:   Protein calorie malnutrition  P:   Tube feeds per dietary, PEG Protonix for SUP  HEMATOLOGIC A:   Anemia chronic Coumadin for AF  P:  Follow CBC am Coumadin continued Monitor trach change for oozing  INFECTIOUS A:   ? Severe  sepsis, most likely UTI. HCAP, Line, wound infection are also possible etiologies Recent klebsiella PNA (amp resistant only) Recent Proteus UTI (macrobid resistant only)   P:   BCx2 8/4 >>> UC 8/4 >>> Sputum 8/4 >>> Abx: Zosyn, start date 8/4 Abx: Vancomycin, start date 8/4, day 8/4  Line was dced' Remains culture neg, never was on pressors Follow BC If in am no mrsa, dc vanc  ENDOCRINE A:   Hyperglycemia without history of DM  P:   Follow glucose on chemistry Add SSI if consistently greater than 180  NEUROLOGIC A:   Encephalopathy, unclear what baseline is.  Quadriplegia Bipolar disorder Vent dyscrhony P:   RASS goal: 0 Continue home depakote (levels per pharm) Monitor WUA and  Dc propofol  FAMILY  - Updates:   - Inter-disciplinary family meet or Palliative Care meeting due by:  8/11  Consider transfer back to [facility] ICU after trach  change Consider ent assessment there  Ccm time 30 min   Mcarthur Rossetti. Tyson Alias, MD, FACP Pgr: [phone]  Pulmonary & Critical Care

## 2015-01-22 NOTE — Progress Notes (Signed)
ANTICOAGULATION CONSULT NOTE - Follow Up Consult  Pharmacy Consult for Coumadin  Indication: Coumadin   Allergies  Allergen Reactions  . Morphine And Related     Unknown     Patient Measurements: Height:  (172.7 cm) Weight: 174 lb 13.2 oz (79.3 kg) IBW/kg (Calculated) : 68.4  Vital Signs: Temp: 98.6 F (37 C) (08/05 1000) BP: 144/83 mmHg (08/05 1000) Pulse Rate: 85 (08/05 1000)  Labs:  Recent Labs  01/21/15 0157 01/22/15 0224  HGB 11.4*  --   HCT 37.2*  --   PLT 478*  --   LABPROT 21.9* 28.5*  INR 1.93* 2.73*  CREATININE 0.51*  --   TROPONINI <0.03  --     Estimated Creatinine Clearance: 79.6 mL/min (by C-G formula based on Cr of 0.51).   Medical History: Past Medical History  Diagnosis Date  . Ventilator dependence   . Atrial fibrillation   . Guillain Barr syndrome   . Hypertension   . Anxiety   . Bipolar disorder   . Functional quadriplegia     Medications:  Prescriptions prior to admission  Medication Sig Dispense Refill Last Dose  . budesonide (PULMICORT) 0.5 MG/2ML nebulizer solution Take 0.5 mg by nebulization 2 (two) times daily.   01/20/2015 at Unknown time  . carvedilol (COREG) 6.25 MG tablet 6.25 mg by PEG Tube route 2 (two) times daily with a meal.   01/20/2015 at Unknown time  . cetaphil (CETAPHIL) lotion Apply 1 application topically daily.   01/20/2015 at Unknown time  . chlorhexidine (PERIDEX) 0.12 % solution Use as directed 15 mLs in the mouth or throat 2 (two) times daily.   01/20/2015 at Unknown time  . collagenase (SANTYL) ointment Apply topically daily. Apply Santyl to yellow areas of buttocks wound Q day, then cover with moist gauze and ABD pad. 15 g 0 01/20/2015 at Unknown time  . ipratropium-albuterol (DUONEB) 0.5-2.5 (3) MG/3ML SOLN Take 3 mLs by nebulization every 6 (six) hours.   01/20/2015 at Unknown time  . valproate (DEPAKENE) 250 MG/5ML syrup Take 250 mg by mouth 2 (two) times daily.   01/20/2015 at Unknown time    Assessment: 75  YOM admitted with respiratory distress on Coumadin prior to admission for Afib. INR today was sub-therapeutic on admission at 1.93. H/H mildly low, Plt high at 478. Now resumed on Coumadin. INR has risen to 2.73 today   PTA Coumadin dose: 2.5 mg daily   Goal of Therapy:  INR 2-3 Monitor platelets by anticoagulation protocol: Yes   Plan:  -Coumadin 1 mg today.  -Monitor daily PT/INR   Vinnie Level, PharmD., BCPS Clinical Pharmacist Pager [phone]

## 2015-01-22 NOTE — Procedures (Signed)
Bronchoscopy Procedure Note / trach change Jordy Verba [id number] 1941-04-07  Procedure: Bronchoscopy Indications: Diagnostic evaluation of the airways, i changed trach over bouche easily to bovana as extra long had some obstruction granulation posterior wall, then bronch to confirm placement and fix bova at appropriat eposition  Procedure Details Consent: Unable to obtain consent because of emergent medical necessity. Time Out: Verified patient identification, verified procedure, site/side was marked, verified correct patient position, special equipment/implants available, medications/allergies/relevent history reviewed, required imaging and test results available.  Performed  In preparation for procedure, patient was given 100% FiO2 and bronchoscope lubricated. Sedation: prop  Airway entered and the following bronchi were examined: Bronchi.   Procedures performed: Brushings performed - no Bronchoscope removed.  , Patient placed back on 100% FiO2 at conclusion of procedure.    Evaluation Hemodynamic Status: BP stable throughout; O2 sats: stable throughout Patient's Current Condition: stable Specimens:  None Complications: none Patient did tolerate procedure well.   Nelda Bucks 01/22/2015   Placed bronch after new bovana, placed bovan at 2.5 cm above trach and fixed Getting pcxr to confirm also Tolerated well  Mcarthur Rossetti. Tyson Alias, MD, FACP Pgr: [phone] [place] [facility]

## 2015-01-22 NOTE — Care Management Note (Signed)
Case Management Note  Patient Details  Name: Quinterrius Errington MRN: [id number] Date of Birth: 02-26-1941  Subjective/Objective:       Plan for discharge back to [facility] today.  However patient is now not ready to go and want to hold here overnight and reeval in morning.  [facility] part can take back either Saturday or Sunday.   Talked with Hazle Nordmann - listed in chart as spouse, this is not the spouse, but the ex wife.  Daughter, [name] is daughter and is the next of kin and contact person.  Daughter works in [place].  Jola Babinski will let her daughter know.  Will make this change in epic.  Daughter called back, updated on patients situation and pending tx back to [facility] side.  Daughter good with this understands this may happen tomorrow.                 Action/Plan:   Expected Discharge Date:                  Expected Discharge Plan:  Long Term Acute Care (LTAC)  In-House Referral:  Clinical Social Work  Discharge planning Services  CM Consult  Post Acute Care Choice:    Choice offered to:     DME Arranged:    DME Agency:     HH Arranged:    HH Agency:     Status of Service:  In process, will continue to follow  Medicare Important Message Given:    Date Medicare IM Given:    Medicare IM give by:    Date Additional Medicare IM Given:    Additional Medicare Important Message give by:     If discussed at Long Length of Stay Meetings, dates discussed:    Additional Comments:  Vangie Bicker, RN 01/22/2015, 2:52 PM

## 2015-01-22 NOTE — Discharge Summary (Signed)
Physician Discharge Summary  Patient ID: Chase Bright MRN: [id number] DOB/AGE: [birth year] 74 y.o.  Admit date: 01/21/2015 Discharge date: 01/22/2015    Discharge Diagnoses:  Guillain-Barre Syndrome  Quadriplegia  Encephalopathy  Bipolar Disorder  Critical Illness Deconditioning  Ventilator Dependent Respiratory Failure Tracheostomy Status  Klebsiella HCAP   Acute Hypercarbic Respiratory Failure Atrial Fibrillation with RVR  Severe Sepsis  Hypotension  Hx HTN Proteus UTI  Hypokalemia  Hypomagnesemia  Hypophosphatemia Severe Protein Calorie Malnutrition  PEG Status  Anemia of Chronic Disease Chronic Anticoagulation  Hyperglycemia                                                                         DISCHARGE PLAN BY DIAGNOSIS     Guillain-Barre Syndrome  Quadriplegia Encephalopathy  Bipolar Disorder  Critical Illness Deconditioning   Discharge Plan: Continue PT efforts Continue valproic acid   Ventilator Dependent Respiratory Failure Klebsiella HCAP  (hx of) Acute Hypercarbic Respiratory Failure Tracheostomy Status   Discharge Plan: Continue PRVC  Vt 580 / R 20 / PEEP 5 / FiO2 to keep saturations 90-95% Intermittent CXR Trach care daily and PRN Duoneb Q6 with PRN albuterol for wheezing  Pulmicort BID   Atrial Fibrillation with RVR  Severe Sepsis  Hypotension  Hx HTN  Discharge Plan: Monitor rate / rhythm    Proteus UTI (hx of) Hypokalemia  Hypomagnesemia  Hypophosphatemia  Discharge Plan: Trend BMP  Replace electrolytes as indicated   Severe Protein Calorie Malnutrition PEG Status   Discharge Plan: Continue Tube Feeding  NPO PEG care daily & PRN   Anemia of Chronic Disease Chronic Anticoagulation   Discharge Plan: Continue coumadin per pharmacy  INR follow up with adjustment   Hyperglycemia   Discharge Plan: Continue SSI  CBG monitoring                  DISCHARGE SUMMARY   Chase Bright is a 74 y.o. 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  Hospital course at that time was complicated by a klebsiella PNA and proteus UTI.    The patient presented to [facility] on 01/21/15 with reports of hypotension with systolic BP in the 22'Q, fever, AF with RVR and respiratory distress despite ventilator support.  ABG at that time showed a pH 7.21 and pCO2 of 104.  Initial CXR was concerning for pneumonia.  The patient was admitted to ICU, treated with sepsis protocol to include volume resuscitation, broad spectrum antibiotics and pan cultures.  Lactic acid was negative.  Central line was discontinued.  Cultures to date are negative.  PICC line inserted 8/4.  During admission, the patient had an episode concerning for mucus plugging with desaturation and underwent emergent bronchoscopy for diagnostic evaluation of the airways.  It was noted that there was significant (60%) obstruction of the airway with granulation tissue (prominent from the posterior wall).  The patient further underwent a second bronchoscopy on 8/5 for  trach change with insertion of Brovana (XLT length) to bypass granulation tissue.  The patient was maintained on mechanical ventilation / PRVC during admission.  Tube feeding support via PEG.  WOC was consulted for extensive sacral wound. Urine culture +  PSEUDOMONAS AERUGINOSA, Suspect acute decompensation was related to possible airway obstruction.  Antibiotics Zoysn till 8-14.Marland Kitchen   He was medically cleared for discharge 01/26/15 with plans as above.                  SIGNIFICANT DIAGNOSTIC STUDIES 8/4  admit 8/4  severe dest, emergent bronch - mucous  RML, granulation obstruction posterior wall 8/5  Trach change to [facility]  8/7 ENT Dr. Redmond Baseman  MICRO DATA  BCx2 8/4 >>>No growth x 4 days on DC. UC 8/4 >>>PSEUDOMONAS AERUGINOSA Sputum 8/4 >>>PSEUDOMONAS AERUGINOSA  ANTIBIOTICS Zosyn, start date 8/4 Vancomycin, start date 8/4  CONSULTS WOC  Ent Dr   Irven Coe / LINES Chronic Trach >> changed to [facility] 8/5 >>  RUE PICC 8/4 >>     Filed Vitals:   01/22/15 1000 01/22/15 1100 01/22/15 1200 01/22/15 1300  BP: 144/83 129/82 84/58 133/93  Pulse: 85 83 87 77  Temp: 98.6 F (37 C) 98.5 F (36.9 C) 98.4 F (36.9 C) 98.1 F (36.7 C)  TempSrc:      Resp: 22 16 20 16   Height:      Weight:      SpO2: 100% 98% 100% 100%     Discharge Labs  BMET  Recent Labs Lab 01/21/15 0157 01/21/15 1520 01/22/15 0200 01/22/15 1100  NA 135  --   --  144  K 4.9  --   --  3.0*  CL 100*  --   --  115*  CO2 24  --   --  24  GLUCOSE 150*  --   --  114*  BUN 41*  --   --  23*  CREATININE 0.51*  --   --  0.38*  CALCIUM 10.5*  --   --  9.0  MG 2.0 1.7 1.7  --   PHOS 5.4* 2.5 2.1*  --    CBC  Recent Labs Lab 01/21/15 0157  HGB 11.4*  HCT 37.2*  WBC 18.3*  PLT 478*   Anti-Coagulation  Recent Labs Lab 01/21/15 0157 01/22/15 0224  INR 1.93* 2.73*      Medication List    ASK your doctor about these medications        budesonide 0.5 MG/2ML nebulizer solution  Commonly known as:  PULMICORT  Take 0.5 mg by nebulization 2 (two) times daily.     carvedilol 6.25 MG tablet  Commonly known as:  COREG  6.25 mg by PEG Tube route 2 (two) times daily with a meal.     cetaphil lotion  Apply 1 application topically daily.     chlorhexidine 0.12 % solution  Commonly known as:  PERIDEX  Use as directed 15 mLs in the mouth or throat 2 (two) times daily.     collagenase ointment  Commonly known as:  SANTYL  Apply topically daily. Apply Santyl to yellow areas of buttocks wound Q day, then cover with moist gauze and ABD pad.      ipratropium-albuterol 0.5-2.5 (3) MG/3ML Soln  Commonly known as:  DUONEB  Take 3 mLs by nebulization every 6 (six) hours.     valproate 250 MG/5ML syrup  Commonly known as:  DEPAKENE  Take 250 mg by mouth 2 (two) times daily.  Disposition:   Discharged Condition: Chase Bright has met maximum benefit of inpatient care and is medically stable and cleared for discharge.  Patient is pending follow up as above.      Time spent on disposition:  Greater than 35 minutes.   Signed:  Richardson Landry Trenise Turay ACNP Maryanna Shape PCCM Pager [phone] till 3 pm If no answer page [phone] 01/26/2015, 11:32 AM

## 2015-01-22 NOTE — Progress Notes (Signed)
Advanced Trach per Dr. Tyson Alias.  PA student at bed side.  Pt Peak Pressures in the 70's.  Exhaled vols 110.  ETCO2 had good color change BBS equal.

## 2015-01-23 ENCOUNTER — Inpatient Hospital Stay (HOSPITAL_COMMUNITY): Payer: Medicare Other

## 2015-01-23 DIAGNOSIS — J189 Pneumonia, unspecified organism: Secondary | ICD-10-CM

## 2015-01-23 DIAGNOSIS — J9611 Chronic respiratory failure with hypoxia: Secondary | ICD-10-CM

## 2015-01-23 DIAGNOSIS — R6520 Severe sepsis without septic shock: Secondary | ICD-10-CM

## 2015-01-23 LAB — POCT I-STAT 3, ART BLOOD GAS (G3+)
Acid-base deficit: 3 mmol/L — ABNORMAL HIGH (ref 0.0–2.0)
Bicarbonate: 21.5 mEq/L (ref 20.0–24.0)
O2 Saturation: 99 %
PH ART: 7.379 (ref 7.350–7.450)
Patient temperature: 99.7
TCO2: 23 mmol/L (ref 0–100)
pCO2 arterial: 36.6 mmHg (ref 35.0–45.0)
pO2, Arterial: 130 mmHg — ABNORMAL HIGH (ref 80.0–100.0)

## 2015-01-23 LAB — GLUCOSE, CAPILLARY
GLUCOSE-CAPILLARY: 102 mg/dL — AB (ref 65–99)
GLUCOSE-CAPILLARY: 168 mg/dL — AB (ref 65–99)
GLUCOSE-CAPILLARY: 93 mg/dL (ref 65–99)
Glucose-Capillary: 121 mg/dL — ABNORMAL HIGH (ref 65–99)
Glucose-Capillary: 97 mg/dL (ref 65–99)
Glucose-Capillary: 112 mg/dL — ABNORMAL HIGH (ref 65–99)

## 2015-01-23 LAB — BASIC METABOLIC PANEL
ANION GAP: 8 (ref 5–15)
Anion gap: 7 (ref 5–15)
BUN: 25 mg/dL — ABNORMAL HIGH (ref 6–20)
BUN: 25 mg/dL — AB (ref 6–20)
CALCIUM: 8.8 mg/dL — AB (ref 8.9–10.3)
CALCIUM: 8.6 mg/dL — AB (ref 8.9–10.3)
CHLORIDE: 106 mmol/L (ref 101–111)
CO2: 23 mmol/L (ref 22–32)
CO2: 23 mmol/L (ref 22–32)
CREATININE: 0.41 mg/dL — AB (ref 0.61–1.24)
Chloride: 113 mmol/L — ABNORMAL HIGH (ref 101–111)
Creatinine, Ser: 0.43 mg/dL — ABNORMAL LOW (ref 0.61–1.24)
GFR calc Af Amer: >60 mL/min (ref >60)
GFR calc non Af Amer: >60 mL/min (ref >60)
Glucose, Bld: 102 mg/dL — ABNORMAL HIGH (ref 65–99)
Glucose, Bld: 141 mg/dL — ABNORMAL HIGH (ref 65–99)
Potassium: 3.8 mmol/L (ref 3.5–5.1)
Potassium: 3.9 mmol/L (ref 3.5–5.1)
SODIUM: 143 mmol/L (ref 135–145)
Sodium: 137 mmol/L (ref 135–145)

## 2015-01-23 LAB — CBC
HCT: 31.6 % — ABNORMAL LOW (ref 39.0–52.0)
HEMOGLOBIN: 9.4 g/dL — AB (ref 13.0–17.0)
MCH: 27.1 pg (ref 26.0–34.0)
MCHC: 29.7 g/dL — AB (ref 30.0–36.0)
MCV: 91.1 fL (ref 78.0–100.0)
Platelets: 320 10*3/uL (ref 150–400)
RBC: 3.47 MIL/uL — ABNORMAL LOW (ref 4.22–5.81)
RDW: 18.1 % — ABNORMAL HIGH (ref 11.5–15.5)
WBC: 10.4 10*3/uL (ref 4.0–10.5)

## 2015-01-23 LAB — PHOSPHORUS: Phosphorus: 2.3 mg/dL — ABNORMAL LOW (ref 2.5–4.6)

## 2015-01-23 LAB — PROTIME-INR
INR: 3.54 — ABNORMAL HIGH (ref 0.00–1.49)
PROTHROMBIN TIME: 34.7 s — AB (ref 11.6–15.2)

## 2015-01-23 LAB — VANCOMYCIN, TROUGH: VANCOMYCIN TR: 21 ug/mL — AB (ref 10.0–20.0)

## 2015-01-23 LAB — MAGNESIUM: Magnesium: 2.2 mg/dL (ref 1.7–2.4)

## 2015-01-23 MED ORDER — FENTANYL CITRATE (PF) 100 MCG/2ML IJ SOLN
25.0000 ug | INTRAMUSCULAR | Status: DC | PRN
  Administered 2015-01-24 (×2): 100 ug via INTRAVENOUS
  Filled 2015-01-23 (×3): qty 2

## 2015-01-23 MED ORDER — CARVEDILOL 3.125 MG PO TABS
3.1250 mg | ORAL_TABLET | Freq: Two times a day (BID) | ORAL | Status: DC
  Administered 2015-01-23 – 2015-01-26 (×5): 3.125 mg via ORAL
  Filled 2015-01-23 (×9): qty 1

## 2015-01-23 MED ORDER — FENTANYL CITRATE (PF) 100 MCG/2ML IJ SOLN
INTRAMUSCULAR | Status: AC
  Filled 2015-01-23: qty 2

## 2015-01-23 MED ORDER — LEVALBUTEROL HCL 1.25 MG/0.5ML IN NEBU
1.2500 mg | INHALATION_SOLUTION | RESPIRATORY_TRACT | Status: DC | PRN
  Administered 2015-01-23: 1.25 mg via RESPIRATORY_TRACT
  Filled 2015-01-23 (×2): qty 0.5

## 2015-01-23 MED ORDER — POTASSIUM PHOSPHATES 15 MMOLE/5ML IV SOLN
20.0000 mmol | Freq: Once | INTRAVENOUS | Status: AC
  Administered 2015-01-23: 20 mmol via INTRAVENOUS
  Filled 2015-01-23: qty 6.67

## 2015-01-23 MED ORDER — MIDAZOLAM HCL 2 MG/2ML IJ SOLN
INTRAMUSCULAR | Status: AC
  Filled 2015-01-23: qty 2

## 2015-01-23 MED ORDER — VALPROIC ACID 250 MG/5ML PO SYRP
500.0000 mg | ORAL_SOLUTION | Freq: Two times a day (BID) | ORAL | Status: DC
  Administered 2015-01-23 – 2015-01-26 (×6): 500 mg via ORAL
  Filled 2015-01-23 (×8): qty 10

## 2015-01-23 MED ORDER — MIDAZOLAM HCL 2 MG/2ML IJ SOLN
2.0000 mg | Freq: Once | INTRAMUSCULAR | Status: AC
  Administered 2015-01-23: 2 mg via INTRAVENOUS

## 2015-01-23 MED ORDER — MIDAZOLAM HCL 2 MG/2ML IJ SOLN
1.0000 mg | INTRAMUSCULAR | Status: DC | PRN
  Administered 2015-01-23 – 2015-01-24 (×4): 2 mg via INTRAVENOUS
  Filled 2015-01-23 (×5): qty 2

## 2015-01-23 MED ORDER — ACETAMINOPHEN 160 MG/5ML PO SOLN: 650.0000 mg | Freq: Four times a day (QID) | ORAL | Status: DC | PRN

## 2015-01-23 MED ORDER — VANCOMYCIN HCL IN DEXTROSE 750-5 MG/150ML-% IV SOLN
750.0000 mg | Freq: Two times a day (BID) | INTRAVENOUS | Status: DC
  Administered 2015-01-23 – 2015-01-24 (×2): 750 mg via INTRAVENOUS
  Filled 2015-01-23 (×3): qty 150

## 2015-01-23 MED ORDER — FENTANYL CITRATE (PF) 100 MCG/2ML IJ SOLN
25.0000 ug | INTRAMUSCULAR | Status: DC | PRN
  Administered 2015-01-23: 100 ug via INTRAVENOUS
  Administered 2015-01-23: 50 ug via INTRAVENOUS
  Administered 2015-01-23 (×2): 100 ug via INTRAVENOUS
  Filled 2015-01-23 (×3): qty 2

## 2015-01-23 NOTE — Progress Notes (Signed)
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 In ED was tachycardic (AF RVR), hypotensive, and febrile with findings concerning for HCAP. PCCM to admit.   STUDIES:    SIGNIFICANT EVENTS: 8/4 admit 8/4- severe dest, emergent bronch - mucous RML, granulation obstruction posterior wall  SUBJECTIVE: calm, no desats overnight  VITAL SIGNS: Temp:  [98.1 F (36.7 C)-99.1 F (37.3 C)] 98.2 F (36.8 C) (08/06 0800) Pulse Rate:  [69-93] 69 (08/06 0800) Resp:  [16-32] 29 (08/06 0800) BP: (84-144)/(57-113) 138/81 mmHg (08/06 0800) SpO2:  [97 %-100 %] 100 % (08/06 0800) FiO2 (%):  [40 %] 40 % (08/06 0800) Weight:  [179 lb 10.8 oz (81.5 kg)] 179 lb 10.8 oz (81.5 kg) (08/06 0343) HEMODYNAMICS:   VENTILATOR SETTINGS: Vent Mode:  [-] PRVC FiO2 (%):  [40 %] 40 % Set Rate:  [20 bmp] 20 bmp Vt Set:  [580 mL] 580 mL PEEP:  [5 cmH20] 5 cmH20 Plateau Pressure:  [16 cmH20-24 cmH20] 16 cmH20 INTAKE / OUTPUT:  Intake/Output Summary (Last 24 hours) at 01/23/15 0917 Last data filed at 01/23/15 0835  Gross per 24 hour  Intake 2432.65 ml  Output   1810 ml  Net 622.65 ml    PHYSICAL EXAMINATION: General:  Chronically ill appearing male in NAD Neuro:  Arouses to verbal, follows simple commands HEENT:  Trach clean Cardiovascular:  s1 s2 RIR Lungs:  Coarse improved Abdomen:  Soft, non-tender, mild distension  Musculoskeletal:  No acute deformity Skin:  Sacral wound  LABS:  CBC  Recent Labs Lab 01/21/15 0157  WBC 18.3*  HGB 11.4*  HCT 37.2*  PLT 478*   Coag's  Recent Labs Lab 01/21/15 0157 01/22/15 0224 01/23/15 0227  INR 1.93* 2.73* 3.54*   BMET  Recent Labs Lab 01/21/15 0157  01/22/15 1100 01/23/15 0837  NA 135 144 143  K 4.9 3.0* 3.8  CL 100* 115* 113*  CO2 24 24 23   BUN 41* 23* 25*  CREATININE 0.51* 0.38* 0.41*  GLUCOSE 150* 114* 102*   Electrolytes  Recent Labs Lab 01/21/15 0157  01/22/15 0200 01/22/15 1100 01/22/15 1345 01/23/15 0215 01/23/15 0837  CALCIUM 10.5*  --   --  9.0  --   --  8.8*  MG 2.0  < > 1.7  --  2.8* 2.2  --   PHOS 5.4*  < > 2.1*  --  3.5 2.3*  --   < > = values in this interval not displayed. Sepsis Markers  Recent Labs Lab 01/21/15 0157 01/21/15 0215  LATICACIDVEN  --  0.66  PROCALCITON <0.10  --    ABG  Recent Labs Lab 01/21/15 0205 01/21/15 0602  PHART 7.398 7.408  PCO2ART 45.7* 36.9  PO2ART 197.0* 98.0   Liver Enzymes  Recent Labs Lab 01/21/15 0157  AST 18  ALT 12*  ALKPHOS 74  BILITOT 0.6  ALBUMIN 2.7*   Cardiac Enzymes  Recent Labs Lab 01/21/15 0157  TROPONINI <0.03   Glucose  Recent Labs Lab 01/22/15 1214 01/22/15 1527 01/22/15 1958 01/23/15 0010 01/23/15 0436 01/23/15 0749  GLUCAP 88 99 106* 102* 121* 93    Imaging Dg Chest 1 View  01/22/2015   CLINICAL DATA:  74 year old male undergoing tracheostomy tube  exchange. Initial encounter.  EXAM: CHEST  1 VIEW  COMPARISON:  0509 hr today, and earlier.  FINDINGS: Portable AP semi upright view at 1152 hrs. Revised tracheostomy tube. The new tube appears to appropriately project over the thoracic inlet and upper tracheal air column. Stable right PICC line. Larger lung volumes. Stable cardiac size and mediastinal contours. No pneumothorax. Stable pulmonary vascularity without edema. No definite pleural effusion. No definite consolidation allowing for more kyphotic view. Calcified atherosclerosis of the aorta.  IMPRESSION: 1. Tracheostomy tube revision with no adverse features. 2. Otherwise stable chest.   Electronically Signed   By: Odessa Fleming M.D.   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 HCAP P:   Cont vent support  - should be able to further weaning efforts with much improved arm strength  Ok for pressure support as tol  F/u CXR  abx as below  Dilation and ablation typically NOT successful Consider ENT eval once returned to [facility]   CARDIOVASCULAR CVL R subclavian from OSH >>>out  RUA PICC 8/4>>> A:  Atrial fibrillation with RVR > improved with volume Hypotension, severe sepsis vs hypovolemia - improved H/o HTN P:  Telemetry monitoring Allow pos balance Resume home coreg at half dose - 3.125 BID  RENAL A:   Hpomag Hpophos P:   F/u chem  Replete Phos 8/6  GASTROINTESTINAL A:   Protein calorie malnutrition P:   Tube feeds per dietary, PEG Protonix for SUP   HEMATOLOGIC A:   Anemia chronic Coumadin for AF P:  Follow CBC am Coumadin per pharmacy - supratherapeutic 8/6   INFECTIOUS A:   ? Severe sepsis, most likely UTI. HCAP, Line, wound infection are also possible etiologies Recent klebsiella PNA (amp resistant only) Recent Proteus UTI (macrobid resistant only)   P:   BCx2 8/4 >>> UC 8/4 >>> Sputum 8/4 >>> Vancomycin 8/4>>> Zosyn 8/4>>>  Cont broad spectrum abx as above    ENDOCRINE A:   Hyperglycemia without history of DM  P:   Follow glucose on chemistry Add SSI if consistently greater than 180   NEUROLOGIC A:   Encephalopathy- improved  Quadriplegia Bipolar disorder Vent dyssynchrony  P:   RASS goal: 0 Continue home depakote (levels per pharm) Monitor PRN fentanyl    FAMILY  - Updates: pt updated 8/6.  No family available  - Inter-disciplinary family meet or Palliative Care meeting due by:  8/11   Continue to monitor in ICU this am and assess readiness for tx back to [facility].  Likely ready for more aggressive weaning efforts.  May need ENT eval at some point.   Dirk Dress, NP 01/23/2015  9:18 AM Pager: [phone] or [phone]

## 2015-01-23 NOTE — Progress Notes (Signed)
eLink Physician-Brief Progress Note Patient Name: Glenmore Karl DOB: 15-Feb-1941 MRN: [id number]   Date of Service  01/23/2015  HPI/Events of Note  Patient with mucus plug again, became unresponsive, plug suctioned out  eICU Interventions  Check abg, bmp, cbc - CXR reviewed, no acute changes.      Intervention Category Major Interventions: Respiratory failure - evaluation and management  Kirby Argueta 01/23/2015, 8:54 PM

## 2015-01-23 NOTE — Progress Notes (Signed)
MEDICATION RELATED CONSULT NOTE - INITIAL   Pharmacy Consult for Valproic Acid Indication: Bipolar Disorder/mood stabilization  Allergies  Allergen Reactions  . Morphine And Related     Unknown     Patient Measurements: Height: 5\' 8"  (172.7 cm) Weight: 179 lb 10.8 oz (81.5 kg) IBW/kg (Calculated) : 68.4   Vital Signs: Temp: 98.5 F (36.9 C) (08/06 1000) Temp Source: Core (Comment) (08/06 0800) BP: 144/94 mmHg (08/06 1000) Pulse Rate: 33 (08/06 1000) Intake/Output from previous day: 08/05 0701 - 08/06 0700 In: 2685.7 [I.V.:735.7; NG/GT:1200; IV Piggyback:600] Out: 2010 [Urine:2010] Intake/Output from this shift: Total I/O In: 192.5 [I.V.:12.5; Other:30; NG/GT:150] Out: -   Labs:  Recent Labs  01/21/15 0157  01/22/15 0200 01/22/15 1100 01/22/15 1345 01/23/15 0215 01/23/15 0837  WBC 18.3*  --   --   --   --   --   --   HGB 11.4*  --   --   --   --   --   --   HCT 37.2*  --   --   --   --   --   --   PLT 478*  --   --   --   --   --   --   CREATININE 0.51*  --   --  0.38*  --   --  0.41*  MG 2.0  < > 1.7  --  2.8* 2.2  --   PHOS 5.4*  < > 2.1*  --  3.5 2.3*  --   ALBUMIN 2.7*  --   --   --   --   --   --   PROT 7.1  --   --   --   --   --   --   AST 18  --   --   --   --   --   --   ALT 12*  --   --   --   --   --   --   ALKPHOS 74  --   --   --   --   --   --   BILITOT 0.6  --   --   --   --   --   --   < > = values in this interval not displayed. Estimated Creatinine Clearance: 79.6 mL/min (by C-G formula based on Cr of 0.41).   Microbiology: Recent Results (from the past 720 hour(s))  Blood Culture (routine x 2)     Status: None (Preliminary result)   Collection Time: 01/21/15  2:15 AM  Result Value Ref Range Status   Specimen Description BLOOD CENTRAL LINE  Final   Special Requests BOTTLES DRAWN AEROBIC AND ANAEROBIC 5CC  Final   Culture NO GROWTH 1 DAY  Final   Report Status PENDING  Incomplete  Blood Culture (routine x 2)     Status: None  (Preliminary result)   Collection Time: 01/21/15  2:40 AM  Result Value Ref Range Status   Specimen Description BLOOD LEFT FOREARM  Final   Special Requests BOTTLES DRAWN AEROBIC AND ANAEROBIC 5CC  Final   Culture NO GROWTH 1 DAY  Final   Report Status PENDING  Incomplete  Urine culture     Status: None (Preliminary result)   Collection Time: 01/21/15  3:04 AM  Result Value Ref Range Status   Specimen Description URINE, RANDOM  Final   Special Requests NONE  Final   Culture CULTURE REINCUBATED FOR BETTER  GROWTH  Final   Report Status PENDING  Incomplete  MRSA PCR Screening     Status: Abnormal   Collection Time: 01/21/15  5:37 AM  Result Value Ref Range Status   MRSA by PCR POSITIVE (A) NEGATIVE Final    Comment:        The GeneXpert MRSA Assay (FDA approved for NASAL specimens only), is one component of a comprehensive MRSA colonization surveillance program. It is not intended to diagnose MRSA infection nor to guide or monitor treatment for MRSA infections. RESULT CALLED TO, READ BACK BY AND VERIFIED WITH: CHARGE RN 9:40 01/21/15 ([email or web address])   Urine culture     Status: None (Preliminary result)   Collection Time: 01/21/15  5:45 AM  Result Value Ref Range Status   Specimen Description URINE, CATHETERIZED  Final   Special Requests NONE  Final   Culture CULTURE REINCUBATED FOR BETTER GROWTH  Final   Report Status PENDING  Incomplete  Culture, respiratory (tracheal aspirate)     Status: None (Preliminary result)   Collection Time: 01/21/15  5:45 AM  Result Value Ref Range Status   Specimen Description TRACHEAL ASPIRATE  Final   Special Requests NONE  Final   Gram Stain   Final    MODERATE WBC PRESENT,BOTH PMN AND MONONUCLEAR NO SQUAMOUS EPITHELIAL CELLS SEEN NO ORGANISMS SEEN Performed at [facility]    Culture   Final    Culture reincubated for better growth Performed at [facility]    Report Status PENDING  Incomplete  Culture, respiratory  (NON-Expectorated)     Status: None (Preliminary result)   Collection Time: 01/21/15  2:33 PM  Result Value Ref Range Status   Specimen Description BRONCHIAL WASHINGS  Final   Special Requests NONE  Final   Gram Stain   Final    FEW WBC PRESENT,BOTH PMN AND MONONUCLEAR RARE SQUAMOUS EPITHELIAL CELLS PRESENT NO ORGANISMS SEEN Performed at [facility]    Culture PENDING  Incomplete   Report Status PENDING  Incomplete   Assessment: 26 YOM with history of bipolar disorder and anxiety on valproic acid syrup  BID. A level drawn yesterday morning was low at 22. This was not a true trough, so true level is likely lower. Unsure of compliance PTA, but as patient came from SNF would assume not many doses would have been missed.  Goal of Therapy:  Valproic Acid level 50-147mcg/mL for mania indication  Plan:  -increase valproic acid syrup to  BID -will check a level with AM labs in 2-4 days -follow for signs of toxicity (lethargy, N/V, respiratory depression, hypotension, tachycardia, hyperthermia)  Lezlie Ritchey D. Mckaylie Vasey, PharmD, BCPS Clinical Pharmacist Pager: [phone] 01/23/2015 10:43 AM

## 2015-01-23 NOTE — Progress Notes (Signed)
eLink Physician-Brief Progress Note Patient Name: Chase Bright DOB: Jan 18, 1941 MRN: [id number]   Date of Service  01/23/2015  HPI/Events of Note  Patient with thick secretions, and mucus plugs, had episode of afib, now back in controlled afib  eICU Interventions  Change albuterol to xopenox.      Intervention Category Major Interventions: Respiratory failure - evaluation and management  Anarely Nicholls 01/23/2015, 5:16 PM

## 2015-01-23 NOTE — Care Management Note (Signed)
Case Management Note  Patient Details  Name: Adhrit Krenz MRN: [id number] Date of Birth: Mar 04, 1941  Subjective/Objective:                    Action/Plan:  Discharge planning Expected Discharge Date:  01/23/15               Expected Discharge Plan:  Long Term Acute Care (LTAC)  In-House Referral:  Clinical Social Work  Discharge planning Services  CM Consult  Post Acute Care Choice:    Choice offered to:     DME Arranged:    DME Agency:     HH Arranged:    HH Agency:     Status of Service:  In process, will continue to follow  Medicare Important Message Given:    Date Medicare IM Given:    Medicare IM give by:    Date Additional Medicare IM Given:    Additional Medicare Important Message give by:     If discussed at Long Length of Stay Meetings, dates discussed:    Additional Comments: CM confirmed ICU bed available at [facility] today; spoke with RN and has requested DC summary and callback and I will continue to arrange.  Waiting for callback from MD/RN. Yves Dill, RN 01/23/2015, 10:21 AM

## 2015-01-23 NOTE — Progress Notes (Signed)
nbcANTIBIOTIC CONSULT NOTE  Pharmacy Consult for Vancomycin/Zosyn  Indication: rule out sepsis  Allergies  Allergen Reactions  . Morphine And Related     Unknown    Patient Measurements: Height:  (172.7 cm) Weight: 179 lb 10.8 oz (81.5 kg) IBW/kg (Calculated) : 68.4  Vital Signs: Temp: 99.1 F (37.3 C) (08/06 1500) Temp Source: Core (Comment) (08/06 1200) BP: 153/101 mmHg (08/06 1500) Pulse Rate: 93 (08/06 1500)  Labs:  Recent Labs  01/21/15 0157 01/22/15 1100 01/23/15 0837  WBC 18.3*  --   --   HGB 11.4*  --   --   PLT 478*  --   --   CREATININE 0.51* 0.38* 0.41*   Estimated Creatinine Clearance: 79.6 mL/min (by C-G formula based on Cr of 0.41).  Assessment: Recent admission in June 2016 for pan-sensitive Klebsiella PNA and proteus UTI. Back with respiratory distress, febrile, hypotensive, elevated WBC.  Started on empiric antibiotics, still with copious secretions. Last WBC remained elevated. SCr 0.41, however patient is a quadriplegic making it difficult to determine an accurate CrCl.  VT this afternoon slightly elevated at 52mcg/mL.  Goal of Therapy:  Vancomycin trough level 15-20 mcg/ml  Plan:  -decrease vancomycin to  IV q12h starting at 1800 -Zosyn 3.375G IV q8h to be infused over 4 hours -Trend WBC, temp, renal function -repeat VT PRN  Dyron Kawano D. Jandel Patriarca, PharmD, BCPS Clinical Pharmacist Pager: [phone] 01/23/2015 4:09 PM

## 2015-01-23 NOTE — Progress Notes (Signed)
ANTICOAGULATION CONSULT NOTE - Follow Up Consult  Pharmacy Consult for Coumadin  Indication: AFib  Allergies  Allergen Reactions  . Morphine And Related     Unknown     Patient Measurements: Height:  (172.7 cm) Weight: 179 lb 10.8 oz (81.5 kg) IBW/kg (Calculated) : 68.4  Vital Signs: Temp: 98.5 F (36.9 C) (08/06 1000) Temp Source: Core (Comment) (08/06 0800) BP: 144/94 mmHg (08/06 1000) Pulse Rate: 33 (08/06 1000)  Labs:  Recent Labs  01/21/15 0157 01/22/15 0224 01/22/15 1100 01/23/15 0227 01/23/15 0837  HGB 11.4*  --   --   --   --   HCT 37.2*  --   --   --   --   PLT 478*  --   --   --   --   LABPROT 21.9* 28.5*  --  34.7*  --   INR 1.93* 2.73*  --  3.54*  --   CREATININE 0.51*  --  0.38*  --  0.41*  TROPONINI <0.03  --   --   --   --     Estimated Creatinine Clearance: 79.6 mL/min (by C-G formula based on Cr of 0.41).  Assessment: 72 YOM admitted with respiratory distress on Coumadin prior to admission for Afib. INR on admission 1.93. INR has increased dramatically with 1 dose of 2.5mg  and 1 dose of . INR this morning 3.54.  PTA Coumadin dose:  daily per med history.  No CBC this morning. No bleeding noted.  Goal of Therapy:  INR 2-3 Monitor platelets by anticoagulation protocol: Yes   Plan:  -hold Coumadin today with high INR -Monitor daily PT/INR -follow for s/s bleeding   Loraine Freid D. Casmere Hollenbeck, PharmD, BCPS Clinical Pharmacist Pager: [phone] 01/23/2015 10:35 AM

## 2015-01-23 NOTE — Progress Notes (Signed)
eLink Physician-Brief Progress Note Patient Name: Chase Bright DOB: [birth year] MRN: [id number]   Date of Service  01/23/2015  HPI/Events of Note  Agitation associated with increased Peak Airway Pressures. Fentanyl IV works for about 30 minutes.   eICU Interventions  Will increase Fentanyl IV dose to Q 1 hour.     Intervention Category Minor Interventions: Agitation / anxiety - evaluation and management  Sommer,Steven Eugene 01/23/2015, 11:46 PM

## 2015-01-23 NOTE — Progress Notes (Signed)
eLink Physician-Brief Progress Note Patient Name: Chase Bright DOB: 05/08/1941 MRN: [id number]   Date of Service  01/23/2015  HPI/Events of Note  Patient with elevated Peak Pressures (60) and Plt Pressure (40), hemodyanmically stable, in no resp distress  eICU Interventions  Check CXR     Intervention Category Intermediate Interventions: Respiratory distress - evaluation and management  Jadwiga Faidley 01/23/2015, 7:43 PM

## 2015-01-24 ENCOUNTER — Inpatient Hospital Stay (HOSPITAL_COMMUNITY): Payer: Medicare Other

## 2015-01-24 DIAGNOSIS — Z930 Tracheostomy status: Secondary | ICD-10-CM

## 2015-01-24 DIAGNOSIS — N390 Urinary tract infection, site not specified: Secondary | ICD-10-CM

## 2015-01-24 LAB — BASIC METABOLIC PANEL
Anion gap: 6 (ref 5–15)
BUN: 30 mg/dL — ABNORMAL HIGH (ref 6–20)
CALCIUM: 9 mg/dL (ref 8.9–10.3)
CO2: 26 mmol/L (ref 22–32)
Chloride: 112 mmol/L — ABNORMAL HIGH (ref 101–111)
Creatinine, Ser: 0.42 mg/dL — ABNORMAL LOW (ref 0.61–1.24)
GFR calc Af Amer: >60 mL/min (ref >60)
Glucose, Bld: 94 mg/dL (ref 65–99)
POTASSIUM: 4.2 mmol/L (ref 3.5–5.1)
SODIUM: 144 mmol/L (ref 135–145)

## 2015-01-24 LAB — URINE CULTURE
Culture: >=100000
Culture: >=100000

## 2015-01-24 LAB — CBC
HCT: 31.3 % — ABNORMAL LOW (ref 39.0–52.0)
Hemoglobin: 9.5 g/dL — ABNORMAL LOW (ref 13.0–17.0)
MCH: 27.9 pg (ref 26.0–34.0)
MCHC: 30.4 g/dL (ref 30.0–36.0)
MCV: 91.8 fL (ref 78.0–100.0)
Platelets: 356 10*3/uL (ref 150–400)
RBC: 3.41 MIL/uL — ABNORMAL LOW (ref 4.22–5.81)
RDW: 18.2 % — AB (ref 11.5–15.5)
WBC: 11.1 10*3/uL — ABNORMAL HIGH (ref 4.0–10.5)

## 2015-01-24 LAB — PROTIME-INR
INR: 2.82 — ABNORMAL HIGH (ref 0.00–1.49)
PROTHROMBIN TIME: 29.2 s — AB (ref 11.6–15.2)

## 2015-01-24 LAB — GLUCOSE, CAPILLARY
Glucose-Capillary: 104 mg/dL — ABNORMAL HIGH (ref 65–99)
Glucose-Capillary: 110 mg/dL — ABNORMAL HIGH (ref 65–99)
Glucose-Capillary: 132 mg/dL — ABNORMAL HIGH (ref 65–99)
Glucose-Capillary: 76 mg/dL (ref 65–99)
Glucose-Capillary: 80 mg/dL (ref 65–99)
Glucose-Capillary: 81 mg/dL (ref 65–99)
Glucose-Capillary: 85 mg/dL (ref 65–99)

## 2015-01-24 LAB — MAGNESIUM: Magnesium: 1.9 mg/dL (ref 1.7–2.4)

## 2015-01-24 LAB — PHOSPHORUS: PHOSPHORUS: 4.1 mg/dL (ref 2.5–4.6)

## 2015-01-24 MED ORDER — PHENYLEPHRINE HCL 10 MG/ML IJ SOLN
30.0000 ug/min | INTRAMUSCULAR | Status: DC
  Administered 2015-01-24: 30 ug/min via INTRAVENOUS
  Filled 2015-01-24: qty 1

## 2015-01-24 MED ORDER — FENTANYL CITRATE (PF) 100 MCG/2ML IJ SOLN
100.0000 ug | Freq: Once | INTRAMUSCULAR | Status: AC
  Administered 2015-01-24: 100 ug via INTRAVENOUS

## 2015-01-24 MED ORDER — MIDAZOLAM HCL 2 MG/2ML IJ SOLN
INTRAMUSCULAR | Status: AC
  Filled 2015-01-24: qty 2

## 2015-01-24 MED ORDER — SODIUM CHLORIDE 0.9 % IV SOLN: 25.0000 ug/h | INTRAVENOUS | Status: DC

## 2015-01-24 MED ORDER — FENTANYL CITRATE (PF) 100 MCG/2ML IJ SOLN: 25.0000 ug | INTRAMUSCULAR | Status: DC | PRN

## 2015-01-24 MED ORDER — SODIUM CHLORIDE 0.9 % IV SOLN
0.0000 ug/h | INTRAVENOUS | Status: DC
  Administered 2015-01-24: 200 ug/h via INTRAVENOUS
  Administered 2015-01-24: 50 ug/h via INTRAVENOUS
  Administered 2015-01-25 – 2015-01-26 (×2): 100 ug/h via INTRAVENOUS
  Filled 2015-01-24 (×5): qty 50

## 2015-01-24 MED ORDER — MIDAZOLAM HCL 2 MG/2ML IJ SOLN
2.0000 mg | Freq: Once | INTRAMUSCULAR | Status: AC
  Administered 2015-01-24: 2 mg via INTRAVENOUS

## 2015-01-24 MED ORDER — PROPOFOL 1000 MG/100ML IV EMUL
5.0000 ug/kg/min | INTRAVENOUS | Status: DC
  Administered 2015-01-24: 10 ug/kg/min via INTRAVENOUS
  Filled 2015-01-24: qty 100

## 2015-01-24 MED ORDER — WARFARIN SODIUM 1 MG PO TABS
1.0000 mg | ORAL_TABLET | Freq: Once | ORAL | Status: AC
  Administered 2015-01-24: 1 mg via ORAL
  Filled 2015-01-24: qty 1

## 2015-01-24 MED ORDER — PHENYLEPHRINE HCL 10 MG/ML IJ SOLN
30.0000 ug/min | INTRAVENOUS | Status: DC
  Administered 2015-01-24: 50 ug/min via INTRAVENOUS
  Filled 2015-01-24: qty 4

## 2015-01-24 NOTE — Progress Notes (Signed)
RT asked to hold off on morning ABG by NP.

## 2015-01-24 NOTE — Progress Notes (Signed)
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 In ED was tachycardic (AF RVR), hypotensive, and febrile with findings concerning for HCAP. PCCM to admit.   STUDIES:    SIGNIFICANT EVENTS: 8/4 admit 8/4- severe dest, emergent bronch - mucous RML, granulation obstruction posterior wall 8/6  increased airway pressures to 60. PB investigated at bedside. Initially, he could not pass suction catheter. However, was able to pass it by moving patient's neck around. Trach stoma felt to be obstructed by posterior wall of trachea  SUBJECTIVE: Overnight events noted Now sedated with propofol gtt Low gr fever Secretions lower but peak pr remains high  VITAL SIGNS: Temp:  [98.5 F (36.9 C)-100.1 F (37.8 C)] 99.1 F (37.3 C) (08/07 0930) Pulse Rate:  [33-137] 84 (08/07 0930) Resp:  [19-38] 30 (08/07 0930) BP: (50-169)/(34-138) 69/53 mmHg (08/07 0930) SpO2:  [96 %-100 %] 100 % (08/07 0930) FiO2 (%):  [40 %-60 %] 40 % (08/07 0925) Weight:  [82.9 kg (182 lb 12.2 oz)] 82.9 kg (182 lb 12.2 oz) (08/07 0452) HEMODYNAMICS:   VENTILATOR SETTINGS: Vent Mode:  [-] PRVC FiO2 (%):  [40 %-60 %] 40 % Set Rate:  [20 bmp-30 bmp] 30 bmp Vt Set:  [500 mL-580 mL] 500 mL PEEP:  [5 cmH20] 5 cmH20 Plateau Pressure:  [17 cmH20-49 cmH20] 47 cmH20 INTAKE / OUTPUT:  Intake/Output Summary (Last 24 hours) at 01/24/15 0940 Last data filed at 01/24/15 6213  Gross per 24 hour  Intake 2698.86 ml  Output   1080 ml  Net 1618.86 ml    PHYSICAL EXAMINATION: General:  Chronically ill appearing male in NAD Neuro: sedated , RASS -3 HEENT:  Trach clean Cardiovascular:  s1 s2 RIR Lungs:  Coarse, decreased BL Abdomen:  Soft, non-tender,  mild distension  Musculoskeletal:  No acute deformity Skin:  Sacral wound  LABS:  CBC  Recent Labs Lab 01/21/15 0157 01/23/15 2120 01/24/15 0521  WBC 18.3* 10.4 11.1*  HGB 11.4* 9.4* 9.5*  HCT 37.2* 31.6* 31.3*  PLT 478* 320 356   Coag's  Recent Labs Lab 01/22/15 0224 01/23/15 0227 01/24/15 0521  INR 2.73* 3.54* 2.82*   BMET  Recent Labs Lab 01/23/15 0837 01/23/15 2120 01/24/15 0521  NA 143 137 144  K 3.8 3.9 4.2  CL 113* 106 112*  CO2 23 23 26   BUN 25* 25* 30*  CREATININE 0.41* 0.43* 0.42*  GLUCOSE 102* 141* 94   Electrolytes  Recent Labs Lab 01/22/15 1345 01/23/15 0215 01/23/15 0837 01/23/15 2120 01/24/15 0521  CALCIUM  --   --  8.8* 8.6* 9.0  MG 2.8* 2.2  --   --  1.9  PHOS 3.5 2.3*  --   --  4.1   Sepsis Markers  Recent Labs Lab 01/21/15 0157 01/21/15 0215  LATICACIDVEN  --  0.66  PROCALCITON <0.10  --    ABG  Recent Labs Lab 01/21/15 0205 01/21/15 0602 01/23/15 2219  PHART 7.398 7.408 7.379  PCO2ART 45.7* 36.9 36.6  PO2ART 197.0* 98.0 130.0*   Liver Enzymes  Recent Labs Lab 01/21/15 0157  AST 18  ALT 12*  ALKPHOS 74  BILITOT 0.6  ALBUMIN 2.7*   Cardiac Enzymes  Recent Labs Lab 01/21/15 0157  TROPONINI <  0.03   Glucose  Recent Labs Lab 01/23/15 1613 01/23/15 2024 01/23/15 2047 01/24/15 0030 01/24/15 0426 01/24/15 0800  GLUCAP 112* 110* 168* 132* 85 104*    Imaging Dg Chest Port 1 View  01/24/2015   CLINICAL DATA:  Tracheostomy.  Ventilator dependence.  EXAM: PORTABLE CHEST - 1 VIEW  COMPARISON:  01/23/2015  FINDINGS: Tracheostomy appliance in the midline. Mild scattered patchy airspace opacities persist without significant interval change. No large effusions. No pneumothorax.  IMPRESSION: No significant interval change in the bilateral airspace opacities.   Electronically Signed   By: Ellery Plunk M.D.   On: 01/24/2015 06:33   Dg Chest Port 1 View  01/23/2015   CLINICAL DATA:  Acute onset of  respiratory failure. Thick secretions. Initial encounter.  EXAM: PORTABLE CHEST - 1 VIEW  COMPARISON:  Chest radiograph performed 01/22/2015  FINDINGS: The patient's tracheostomy tube is seen ending 6-7 cm above the carina. A right PICC is noted ending about the mid to distal SVC.  Mild right midlung and left basilar airspace opacities likely reflect mild pneumonia. No pleural effusion or pneumothorax is seen.  The cardiomediastinal silhouette is borderline normal in size. No acute osseous abnormalities are identified.  IMPRESSION: Mild right mid lung and left basilar airspace opacities likely reflect mild pneumonia, given the patient's symptoms.   Electronically Signed   By: Roanna Raider M.D.   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 HCAP Tracheal obstruction -intermittent  P:   Cont vent support  Ok for pressure support as tol once acute issues resolved with trach   - should be able to further weaning efforts with much improved arm strength  Consider ENT eval , proceed with bronch to evaluate  CARDIOVASCULAR CVL R subclavian from OSH >>>out  RUA PICC 8/4>>> A:  Atrial fibrillation with RVR > improved with volume Hypotension, severe sepsis vs hypovolemia - improved, 8/6 Back on pressors H/o HTN P:  Telemetry monitoring Dc coreg while on levo gtt  RENAL A:   Hpomag Hpophos P:     Replete lytes as needed  GASTROINTESTINAL A:   Protein calorie malnutrition P:   Tube feeds per dietary, PEG Protonix for SUP   HEMATOLOGIC A:   Anemia chronic Coumadin for AF P:  Coumadin per pharmacy - supratherapeutic 8/6   INFECTIOUS A:  Septic shock  most likely UTI. HCAP, Line changed  Recent klebsiella PNA (amp resistant only) Recent Proteus UTI (macrobid resistant only)   P:   BCx2 8/4 >>> UC 8/4 >>> enterococcus, pseudomonas Sputum 8/4 >>> Vancomycin  8/4>>> Zosyn 8/4>>>  Cont broad spectrum abx as above    ENDOCRINE A:   Hyperglycemia without history of DM  P:   CBgs Add SSI if consistently greater than 180   NEUROLOGIC A:   Encephalopathy- improved  Quadriplegia Bipolar disorder Vent dyssynchrony  P:   RASS goal: 0 Continue home depakote (levels per pharm) PRN fentanyl    FAMILY  - Updates: pt updated 8/6.  No family available  - Inter-disciplinary family meet or Palliative Care meeting due by:  8/11   Not ready for tx back to [facility] until trach issues resolved.  Likely ready for more aggressive weaning efforts.  May need ENT eval at some point.   The patient is critically ill with multiple organ systems failure and requires high complexity decision making for assessment and  support, frequent evaluation and titration of therapies, application of advanced monitoring technologies and extensive interpretation of multiple databases. Critical Care Time devoted to patient care services described in this note independent of APP time is 35 minutes.   Cyril Mourning MD. Tonny Bollman. [facility] Pager [phone] If no response call [phone]    01/24/2015  9:40 AM

## 2015-01-24 NOTE — Consult Note (Signed)
Reason for Consult:Tracheostomy problems Referring Physician: CCM  Waco Foerster is an 74 y.o. male.  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  He was admitted to hospital 8/4 due to sepsis and pneumonia.  There has been difficulty with mechanical ventilation with plugging and high airway pressures.  His #6 Shiley trach tube was changed to a #6 Bivone after CCM saw granulation at the tip of the trach tube on bronchoscopy.  Still, he is having difficulty with mechanical ventilation.  Past Medical History  Diagnosis Date  . Ventilator dependence   . Atrial fibrillation   . Guillain Barr syndrome   . Hypertension   . Anxiety   . Bipolar disorder   . Functional quadriplegia     History reviewed. No pertinent past surgical history.  No family history on file.  Social History:  has no tobacco, alcohol, and drug history on file.  Allergies:  Allergies  Allergen Reactions  . Morphine And Related     Unknown     Medications: I have reviewed the patient's current medications.  Results for orders placed or performed during the hospital encounter of 01/21/15 (from the past 48 hour(s))  Glucose, capillary     Status: None   Collection Time: 01/22/15  3:27 PM  Result Value Ref Range   Glucose-Capillary 99 65 - 99 mg/dL  Glucose, capillary     Status: Abnormal   Collection Time: 01/22/15  7:58 PM  Result Value Ref Range   Glucose-Capillary 106 (H) 65 - 99 mg/dL  Glucose, capillary     Status: Abnormal   Collection Time: 01/23/15 12:10 AM  Result Value Ref Range   Glucose-Capillary 102 (H) 65 - 99 mg/dL  Magnesium     Status: None   Collection Time: 01/23/15  2:15 AM  Result Value Ref Range   Magnesium 2.2 1.7 - 2.4 mg/dL  Phosphorus     Status: Abnormal   Collection Time: 01/23/15  2:15 AM  Result Value Ref Range   Phosphorus 2.3 (L) 2.5 - 4.6 mg/dL  Protime-INR      Status: Abnormal   Collection Time: 01/23/15  2:27 AM  Result Value Ref Range   Prothrombin Time 34.7 (H) 11.6 - 15.2 seconds   INR 3.54 (H) 0.00 - 1.49  Glucose, capillary     Status: Abnormal   Collection Time: 01/23/15  4:36 AM  Result Value Ref Range   Glucose-Capillary 121 (H) 65 - 99 mg/dL  Glucose, capillary     Status: None   Collection Time: 01/23/15  7:49 AM  Result Value Ref Range   Glucose-Capillary 93 65 - 99 mg/dL  Basic metabolic panel     Status: Abnormal   Collection Time: 01/23/15  8:37 AM  Result Value Ref Range   Sodium 143 135 - 145 mmol/L   Potassium 3.8 3.5 - 5.1 mmol/L   Chloride 113 (H) 101 - 111 mmol/L   CO2 23 22 - 32 mmol/L   Glucose, Bld 102 (H) 65 - 99 mg/dL   BUN 25 (H) 6 - 20 mg/dL   Creatinine, Ser 0.41 (L) 0.61 - 1.24 mg/dL   Calcium 8.8 (L) 8.9 - 10.3 mg/dL   GFR calc non Af Amer >60 >60 mL/min   GFR calc Af Amer >60 >60 mL/min    Comment: (NOTE) The eGFR has been calculated using the CKD EPI equation. This calculation has not been validated  in all clinical situations. eGFR's persistently <60 mL/min signify possible Chronic Kidney Disease.    Anion gap 7 5 - 15  Glucose, capillary     Status: None   Collection Time: 01/23/15 11:21 AM  Result Value Ref Range   Glucose-Capillary 97 65 - 99 mg/dL  Vancomycin, trough     Status: Abnormal   Collection Time: 01/23/15  3:28 PM  Result Value Ref Range   Vancomycin Tr 21 (H) 10.0 - 20.0 ug/mL  Glucose, capillary     Status: Abnormal   Collection Time: 01/23/15  4:13 PM  Result Value Ref Range   Glucose-Capillary 112 (H) 65 - 99 mg/dL  Glucose, capillary     Status: Abnormal   Collection Time: 01/23/15  8:24 PM  Result Value Ref Range   Glucose-Capillary 110 (H) 65 - 99 mg/dL   Comment 1 Notify RN   Glucose, capillary     Status: Abnormal   Collection Time: 01/23/15  8:47 PM  Result Value Ref Range   Glucose-Capillary 168 (H) 65 - 99 mg/dL  Basic metabolic panel     Status: Abnormal    Collection Time: 01/23/15  9:20 PM  Result Value Ref Range   Sodium 137 135 - 145 mmol/L   Potassium 3.9 3.5 - 5.1 mmol/L   Chloride 106 101 - 111 mmol/L   CO2 23 22 - 32 mmol/L   Glucose, Bld 141 (H) 65 - 99 mg/dL   BUN 25 (H) 6 - 20 mg/dL   Creatinine, Ser 0.43 (L) 0.61 - 1.24 mg/dL   Calcium 8.6 (L) 8.9 - 10.3 mg/dL   GFR calc non Af Amer >60 >60 mL/min   GFR calc Af Amer >60 >60 mL/min    Comment: (NOTE) The eGFR has been calculated using the CKD EPI equation. This calculation has not been validated in all clinical situations. eGFR's persistently <60 mL/min signify possible Chronic Kidney Disease.    Anion gap 8 5 - 15  CBC     Status: Abnormal   Collection Time: 01/23/15  9:20 PM  Result Value Ref Range   WBC 10.4 4.0 - 10.5 K/uL   RBC 3.47 (L) 4.22 - 5.81 MIL/uL   Hemoglobin 9.4 (L) 13.0 - 17.0 g/dL   HCT 31.6 (L) 39.0 - 52.0 %   MCV 91.1 78.0 - 100.0 fL   MCH 27.1 26.0 - 34.0 pg   MCHC 29.7 (L) 30.0 - 36.0 g/dL   RDW 18.1 (H) 11.5 - 15.5 %   Platelets 320 150 - 400 K/uL  I-STAT 3, arterial blood gas (G3+)     Status: Abnormal   Collection Time: 01/23/15 10:19 PM  Result Value Ref Range   pH, Arterial 7.379 7.350 - 7.450   pCO2 arterial 36.6 35.0 - 45.0 mmHg   pO2, Arterial 130.0 (H) 80.0 - 100.0 mmHg   Bicarbonate 21.5 20.0 - 24.0 mEq/L   TCO2 23 0 - 100 mmol/L   O2 Saturation 99.0 %   Acid-base deficit 3.0 (H) 0.0 - 2.0 mmol/L   Patient temperature 99.7 F    Collection site RADIAL, ALLEN'S TEST ACCEPTABLE    Drawn by RT    Sample type ARTERIAL   Glucose, capillary     Status: Abnormal   Collection Time: 01/24/15 12:30 AM  Result Value Ref Range   Glucose-Capillary 132 (H) 65 - 99 mg/dL   Comment 1 Notify RN   Glucose, capillary     Status: None   Collection Time: 01/24/15  4:26 AM  Result Value Ref Range   Glucose-Capillary 85 65 - 99 mg/dL  Protime-INR     Status: Abnormal   Collection Time: 01/24/15  5:21 AM  Result Value Ref Range   Prothrombin Time  29.2 (H) 11.6 - 15.2 seconds   INR 2.82 (H) 0.00 - 1.49  CBC     Status: Abnormal   Collection Time: 01/24/15  5:21 AM  Result Value Ref Range   WBC 11.1 (H) 4.0 - 10.5 K/uL   RBC 3.41 (L) 4.22 - 5.81 MIL/uL   Hemoglobin 9.5 (L) 13.0 - 17.0 g/dL   HCT 31.3 (L) 39.0 - 52.0 %   MCV 91.8 78.0 - 100.0 fL   MCH 27.9 26.0 - 34.0 pg   MCHC 30.4 30.0 - 36.0 g/dL   RDW 18.2 (H) 11.5 - 15.5 %   Platelets 356 150 - 400 K/uL  Basic metabolic panel     Status: Abnormal   Collection Time: 01/24/15  5:21 AM  Result Value Ref Range   Sodium 144 135 - 145 mmol/L    Comment: DELTA CHECK NOTED   Potassium 4.2 3.5 - 5.1 mmol/L   Chloride 112 (H) 101 - 111 mmol/L   CO2 26 22 - 32 mmol/L   Glucose, Bld 94 65 - 99 mg/dL   BUN 30 (H) 6 - 20 mg/dL   Creatinine, Ser 0.42 (L) 0.61 - 1.24 mg/dL   Calcium 9.0 8.9 - 10.3 mg/dL   GFR calc non Af Amer >60 >60 mL/min   GFR calc Af Amer >60 >60 mL/min    Comment: (NOTE) The eGFR has been calculated using the CKD EPI equation. This calculation has not been validated in all clinical situations. eGFR's persistently <60 mL/min signify possible Chronic Kidney Disease.    Anion gap 6 5 - 15  Magnesium     Status: None   Collection Time: 01/24/15  5:21 AM  Result Value Ref Range   Magnesium 1.9 1.7 - 2.4 mg/dL  Phosphorus     Status: None   Collection Time: 01/24/15  5:21 AM  Result Value Ref Range   Phosphorus 4.1 2.5 - 4.6 mg/dL  Glucose, capillary     Status: Abnormal   Collection Time: 01/24/15  8:00 AM  Result Value Ref Range   Glucose-Capillary 104 (H) 65 - 99 mg/dL  Glucose, capillary     Status: None   Collection Time: 01/24/15 11:49 AM  Result Value Ref Range   Glucose-Capillary 81 65 - 99 mg/dL    Dg Chest Port 1 View  01/24/2015   CLINICAL DATA:  Tracheostomy.  Ventilator dependence.  EXAM: PORTABLE CHEST - 1 VIEW  COMPARISON:  01/23/2015  FINDINGS: Tracheostomy appliance in the midline. Mild scattered patchy airspace opacities persist without  significant interval change. No large effusions. No pneumothorax.  IMPRESSION: No significant interval change in the bilateral airspace opacities.   Electronically Signed   By: Andreas Newport M.D.   On: 01/24/2015 06:33   Dg Chest Port 1 View  01/23/2015   CLINICAL DATA:  Acute onset of respiratory failure. Thick secretions. Initial encounter.  EXAM: PORTABLE CHEST - 1 VIEW  COMPARISON:  Chest radiograph performed 01/22/2015  FINDINGS: The patient's tracheostomy tube is seen ending 6-7 cm above the carina. A right PICC is noted ending about the mid to distal SVC.  Mild right midlung and left basilar airspace opacities likely reflect mild pneumonia. No pleural effusion or pneumothorax is seen.  The cardiomediastinal silhouette is  borderline normal in size. No acute osseous abnormalities are identified.  IMPRESSION: Mild right mid lung and left basilar airspace opacities likely reflect mild pneumonia, given the patient's symptoms.   Electronically Signed   By: Garald Balding M.D.   On: 01/23/2015 20:49    Review of Systems  Unable to perform ROS: intubated   Blood pressure 140/85, pulse 102, temperature 99.5 F (37.5 C), temperature source Core (Comment), resp. rate 28, height 5' 8"  (1.727 m), weight 82.9 kg (182 lb 12.2 oz), SpO2 100 %. Physical Exam  Constitutional: He is oriented to person, place, and time. He appears well-developed and well-nourished.  HENT:  Head: Normocephalic and atraumatic.  Right Ear: External ear normal.  Left Ear: External ear normal.  Nose: Nose normal.  Mouth/Throat: Oropharynx is clear and moist.  Eyes: Conjunctivae and EOM are normal. Pupils are equal, round, and reactive to light.  Neck: Normal range of motion. Neck supple.  #6 Bivona trach tube in place, on ventilator.  Cardiovascular: Normal rate.   Respiratory: Effort normal.  Neurological: He is alert and oriented to person, place, and time. No cranial nerve deficit.  Skin: Skin is warm and dry.     Assessment/Plan: Tracheostomy dependence, displaced tracheostomy tube Fiberoptic examination through the Bivona tube demonstrated a wall of tissue at the tip.  The tube was removed and the scope placed through the stoma revealing a normal-appearing trachea and primary bronchi with some rawness of the posterior tracheal wall but no obstructing granulation or tissue.  There was some difficulty getting a #6 cuffed Shiley tube in good position so he was up-sized to a #8 cuffed Shiley that was placed in proper position with excellent results from the ventilator.  Should do fine henceforth.  Roshanda Balazs 01/24/2015, 2:09 PM

## 2015-01-24 NOTE — Procedures (Signed)
Preop diagnosis: Tracheostomy dependence, displaced tracheostomy Postop diagnosis: same Procedure: Fiberoptic tracheostomy through established stoma Surgeon: Jenne Pane Anesth: None Compl: None Findings: The Bivona tracheostomy tube was found to be sitting against the tracheal wall.  With the tube removed, the trachea appeared normal with only some rawness of the posterior wall but no granulation tissue or other obstructing tissue down to the secondary bronchi.  A #8 cuffed Shiley was placed in good position. Description: The patient was identified in the ICU.  The ventilator was detached from the trach tube and the fiberoptic scope was passed down the tube.  The trach tube was then removed and the scope was passed through the stoma to view the trachea and primary bronchi.  The scope was removed.  A #6 Shiley trach tube was placed but did not fully pass into the trachea although the scope through the tube showed it to be within the trachea.  A #8 cuffed Shiley was then placed into good position as confirmed by passing the scope through the tube and by ventilator readings.  A new Velcro trach tie was placed.  He was returned to nursing care in stable condition.

## 2015-01-24 NOTE — Progress Notes (Signed)
Called to bedside. PT's pAWp in 60s.. His rr was elevated and not returning full volumes. Had some difficulty passing suction tube. STAT CXR was obtained. The tip of the brovana appears to be rotates some. Suspect again the tip is rotated towards the posterior wall. His pressures improve w/ head positioning and sedation. Obviously this is not a long term solution but at least he can get his Vt and his pressures fall into the low 40s.   Assessment Upper airway obstruction due to Brovana positioning. Per Dr Gwendolyn Grant note there is granulation at the posterior wall already. Suspect that he will need ENT assessment in order for this to be addressed definitively  Plan Cont full vent support  Sedate w/ diprivan and PRN fentanyl  Hypotension due to sedating meds Plan Start Neo to support BP.   Simonne Martinet ACNP-BC [facility] Pulmonary/Critical Care Pager # [phone] OR # [phone] if no answer

## 2015-01-24 NOTE — Procedures (Signed)
Bronchoscopy Procedure Note Chase Bright [id number] [birth year]  Procedure: Bronchoscopy Indications: Diagnostic evaluation of the airways  Procedure Details Consent: Unable to obtain consent because of altered level of consciousness. Time Out: Verified patient identification, verified procedure, site/side was marked, verified correct patient position, special equipment/implants available, medications/allergies/relevent history reviewed, required imaging and test results available.  Performed  In preparation for procedure, patient was given 100% FiO2 and bronchoscope lubricated. Sedation: Benzodiazepines + fentanyl  Airway entered and the following bronchi were examined: Bronchi. Distal end of  Tstomy tube was 5 cm above carina. Minimal secretions noted either in the trachea or distal airways. I did not see any granulation tissue in the trachea or significant tracheomalacia Bronchoscope removed.  , Patient placed back on 100% FiO2 at conclusion of procedure.    Evaluation Hemodynamic Status: BP stable throughout; O2 sats: stable throughout Patient's Current Condition: stable Specimens:  None Complications: No apparent complications Patient did tolerate procedure well.   Oretha Milch MD 01/24/2015

## 2015-01-24 NOTE — Progress Notes (Signed)
RN called RT to patient room due to being unable to pass suction catheter.  Bagged lavaged patient and was able to pass suction catheter after.  Suctioned small amount of thick white sputum.  Will continue to monitor.

## 2015-01-24 NOTE — Progress Notes (Signed)
Trach changed from #6 Bivona to #8 shiley, cuffed, by ENT physician.  No complications noted.  Will continue to monitor.

## 2015-01-24 NOTE — Progress Notes (Signed)
ANTICOAGULATION CONSULT NOTE - Follow Up Consult  Pharmacy Consult for Coumadin  Indication: AFib  Allergies  Allergen Reactions  . Morphine And Related     Unknown     Patient Measurements: Height:  (172.7 cm) Weight: 182 lb 12.2 oz (82.9 kg) IBW/kg (Calculated) : 68.4  Vital Signs: Temp: 99.1 F (37.3 C) (08/07 0930) Temp Source: Core (Comment) (08/07 0800) BP: 69/53 mmHg (08/07 0930) Pulse Rate: 84 (08/07 0930)  Labs:  Recent Labs  01/22/15 0224  01/23/15 0227 01/23/15 0837 01/23/15 2120 01/24/15 0521  HGB  --   --   --   --  9.4* 9.5*  HCT  --   --   --   --  31.6* 31.3*  PLT  --   --   --   --  320 356  LABPROT 28.5*  --  34.7*  --   --  29.2*  INR 2.73*  --  3.54*  --   --  2.82*  CREATININE  --   < >  --  0.41* 0.43* 0.42*  < > = values in this interval not displayed.  Estimated Creatinine Clearance: 86.3 mL/min (by C-G formula based on Cr of 0.42).  Assessment: 35 YOM admitted with respiratory distress on Coumadin prior to admission for Afib. INR on admission 1.93. Warfarin was held last night d/t elevated INR. This morning, INR is back in range at 2.82.  PTA Coumadin dose:  daily per med history.  Hgb 9.5, plts 356- stable, no bleeding noted.  Goal of Therapy:  INR 2-3 Monitor platelets by anticoagulation protocol: Yes   Plan:  -warfarin  po x1 tonight  -Monitor daily PT/INR -follow for s/s bleeding   Amyia Lodwick D. Shakirra Buehler, PharmD, BCPS Clinical Pharmacist Pager: [phone] 01/24/2015 9:40 AM

## 2015-01-25 ENCOUNTER — Inpatient Hospital Stay (HOSPITAL_COMMUNITY): Payer: Medicare Other

## 2015-01-25 DIAGNOSIS — J9600 Acute respiratory failure, unspecified whether with hypoxia or hypercapnia: Secondary | ICD-10-CM

## 2015-01-25 LAB — CBC
HEMATOCRIT: 28.5 % — AB (ref 39.0–52.0)
HEMOGLOBIN: 8.6 g/dL — AB (ref 13.0–17.0)
MCH: 26.9 pg (ref 26.0–34.0)
MCHC: 30.2 g/dL (ref 30.0–36.0)
MCV: 89.1 fL (ref 78.0–100.0)
Platelets: 321 10*3/uL (ref 150–400)
RBC: 3.2 MIL/uL — AB (ref 4.22–5.81)
RDW: 17.8 % — ABNORMAL HIGH (ref 11.5–15.5)
WBC: 7.3 10*3/uL (ref 4.0–10.5)

## 2015-01-25 LAB — GLUCOSE, CAPILLARY
GLUCOSE-CAPILLARY: 101 mg/dL — AB (ref 65–99)
GLUCOSE-CAPILLARY: 77 mg/dL (ref 65–99)
GLUCOSE-CAPILLARY: 86 mg/dL (ref 65–99)
GLUCOSE-CAPILLARY: 91 mg/dL (ref 65–99)
Glucose-Capillary: 82 mg/dL (ref 65–99)
Glucose-Capillary: 83 mg/dL (ref 65–99)

## 2015-01-25 LAB — CULTURE, RESPIRATORY

## 2015-01-25 LAB — BASIC METABOLIC PANEL
Anion gap: 10 (ref 5–15)
BUN: 31 mg/dL — ABNORMAL HIGH (ref 6–20)
CHLORIDE: 109 mmol/L (ref 101–111)
CO2: 24 mmol/L (ref 22–32)
Calcium: 8.7 mg/dL — ABNORMAL LOW (ref 8.9–10.3)
Creatinine, Ser: 0.45 mg/dL — ABNORMAL LOW (ref 0.61–1.24)
GFR calc non Af Amer: >60 mL/min (ref >60)
GLUCOSE: 85 mg/dL (ref 65–99)
Potassium: 3.4 mmol/L — ABNORMAL LOW (ref 3.5–5.1)
Sodium: 143 mmol/L (ref 135–145)

## 2015-01-25 LAB — CULTURE, RESPIRATORY W GRAM STAIN

## 2015-01-25 LAB — PROTIME-INR
INR: 2.65 — ABNORMAL HIGH (ref 0.00–1.49)
Prothrombin Time: 27.9 seconds — ABNORMAL HIGH (ref 11.6–15.2)

## 2015-01-25 LAB — TRIGLYCERIDES: TRIGLYCERIDES: 121 mg/dL (ref <150)

## 2015-01-25 MED ORDER — POTASSIUM CHLORIDE 10 MEQ/50ML IV SOLN
10.0000 meq | INTRAVENOUS | Status: AC
  Administered 2015-01-25 (×2): 10 meq via INTRAVENOUS
  Filled 2015-01-25 (×2): qty 50

## 2015-01-25 MED ORDER — WARFARIN SODIUM 1 MG PO TABS
1.0000 mg | ORAL_TABLET | Freq: Once | ORAL | Status: AC
  Administered 2015-01-25: 1 mg via ORAL
  Filled 2015-01-25: qty 1

## 2015-01-25 NOTE — Progress Notes (Signed)
[facility] ADULT ICU REPLACEMENT PROTOCOL FOR AM LAB REPLACEMENT ONLY  The patient does not apply for the [facility] Adult ICU Electrolyte Replacment Protocol based on the criteria listed below:   1. Is GFR >/= 40 ml/min? Yes.    Patient's GFR today is >60 2. Is urine output >/= 0.5 ml/kg/hr for the last 6 hours? No. Patient's UOP is 0.3 ml/kg/hr 3. Is BUN < 60 mg/dL? Yes.    Patient's BUN today is 31 4. Abnormal electrolyte(s  K+3.4 5. Ordered repletion with: NA 6. If a panic level lab has been reported, has the CCM MD in charge been notified? No..   Physician:  Anselm Pancoast, Renae Fickle Hilliard 01/25/2015 5:33 AM

## 2015-01-25 NOTE — Clinical Social Work Note (Signed)
CSW consult acknowledged:  Clinical Social Worker received consult indicating patient was admitted from a facility. CSW attempted to contact pt's dtr, Inetta Fermo and left a message for a returned phone call. Patient from [facility] SNF.   CSW will continue to follow.   Derenda Fennel, MSW, LCSWA [phone] 01/25/2015 10:28 AM

## 2015-01-25 NOTE — Progress Notes (Signed)
ANTICOAGULATION CONSULT NOTE - Follow Up Consult  Pharmacy Consult for Coumadin  Indication: AFib  Allergies  Allergen Reactions  . Morphine And Related     Unknown     Patient Measurements: Height:  (172.7 cm) Weight: 184 lb 1.4 oz (83.5 kg) IBW/kg (Calculated) : 68.4  Vital Signs: Temp: 99.4 F (37.4 C) (08/08 0800) Temp Source: Core (Comment) (08/08 0400) BP: 118/74 mmHg (08/08 0837) Pulse Rate: 80 (08/08 0837)  Labs:  Recent Labs  01/23/15 0227  01/23/15 2120 01/24/15 0521 01/25/15 0400  HGB  --   < > 9.4* 9.5* 8.6*  HCT  --   --  31.6* 31.3* 28.5*  PLT  --   --  320 356 321  LABPROT 34.7*  --   --  29.2* 27.9*  INR 3.54*  --   --  2.82* 2.65*  CREATININE  --   < > 0.43* 0.42* 0.45*  < > = values in this interval not displayed.  Estimated Creatinine Clearance: 86.5 mL/min (by C-G formula based on Cr of 0.45).  Assessment: 89 YOM admitted with respiratory distress on Coumadin prior to admission for Afib. INR has been fluctuating, now 2.65. PTA Coumadin dose:  daily per med history. Hgb 8.6, plts 321- stable.  Goal of Therapy:  INR 2-3 Monitor platelets by anticoagulation protocol: Yes   Plan:  -warfarin  po x1  -Monitor daily PT/INR -follow for s/s bleeding    Agapito Games, PharmD, BCPS Clinical Pharmacist Pager: [phone] 01/25/2015 9:11 AM

## 2015-01-25 NOTE — Clinical Social Work Note (Signed)
Clinical Social Work Assessment  Patient Details  Name: Chase Bright MRN: [id number] Date of Birth: [birth year]  Date of referral:  01/25/15               Reason for consult:  Discharge Planning                Permission sought to share information with:  Case Manager, Facility Medical sales representative, Family Supports Permission granted to share information::  Yes, Verbal Permission Granted  Name::      Chase Bright)  Agency::   [facility] SNF)  Relationship::   (Daughter )  Contact Information:   [phone])  Housing/Transportation Living arrangements for the past 2 months:  Skilled Nursing Facility Source of Information:  Adult Children Patient Interpreter Needed:  None Criminal Activity/Legal Involvement Pertinent to Current Situation/Hospitalization:  No - Comment as needed Significant Relationships:  Adult Children Lives with:  Facility Resident Do you feel safe going back to the place where you live?  Yes Need for family participation in patient care:  Yes (Comment)  Care giving concerns:  Patient is ventilator dependent.   Social Worker assessment / plan:  Visual merchandiser spoke with patient's dtr, Inetta Fermo in reference to discharge planning. Patient daughter and facility representative confirmed that patient is a resident. Patient has been a resident at [facility] since May 2016. Patient's dtr reported patient has been in and out of [facility] since and is hopeful that he will recover at some point. Pt's dtr aware of plan for LTACH at [facility]. Per RNCM pt will likely transfer tomorrow, 8/9. CSW also completed FL-2 and faxed clinicals to [facility] SNF for review in the event pt is unable to be admitted into [facility]. Clinical Social Worker will sign off for now as social work intervention is no longer needed. Please consult [place] again if new need arises.  Employment status:  Retired Health and safety inspector:  Medicare PT Recommendations:  Not assessed at this  time Information / Referral to community resources:  Skilled Nursing Facility  Patient/Family's Response to care:  Pt is vent dependent. Pt's dtr agreeable to [facility] placement. Pt's dtr pleasant and appreciated call from social work.   Patient/Family's Understanding of and Emotional Response to Diagnosis, Current Treatment, and Prognosis:  Pt's dtr understanding of discharge planning for LTACH.   Emotional Assessment Appearance:  Appears stated age Attitude/Demeanor/Rapport:  Intubated (Following Commands or Not Following Commands), Unable to Assess Affect (typically observed):  Unable to Assess Orientation:  Oriented to Self Alcohol / Substance use:  Not Applicable Psych involvement (Current and /or in the community):  No (Comment)  Discharge Needs  Concerns to be addressed:  Care Coordination Readmission within the last 30 days:  No Current discharge risk:  Chronically ill Barriers to Discharge:  Continued Medical Work up   [employer], MSW, LCSWA [phone] 01/25/2015 4:19 PM

## 2015-01-25 NOTE — Progress Notes (Addendum)
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 In ED was tachycardic (AF RVR), hypotensive, and febrile with findings concerning for HCAP. PCCM to admit.   STUDIES:    SIGNIFICANT EVENTS: 8/4 admit 8/4- severe dest, emergent bronch - mucous RML, granulation obstruction posterior wall 8/6  increased airway pressures to 60. PB investigated at bedside. Initially, he could not pass suction catheter. However, was able to pass it by moving patient's neck around. Trach stoma felt to be obstructed by posterior wall of trachea  SUBJECTIVE:  Peak pressures better today but still has lots of secretions.   VITAL SIGNS: Temp:  [99.1 F (37.3 C)-100.1 F (37.8 C)] 99.4 F (37.4 C) (08/08 0800) Pulse Rate:  [26-144] 80 (08/08 0837) Resp:  [16-38] 20 (08/08 0837) BP: (69-150)/(51-110) 118/74 mmHg (08/08 0837) SpO2:  [87 %-100 %] 100 % (08/08 0837) FiO2 (%):  [40 %] 40 % (08/08 0837) Weight:  [184 lb 1.4 oz (83.5 kg)] 184 lb 1.4 oz (83.5 kg) (08/08 0411) HEMODYNAMICS:   VENTILATOR SETTINGS: Vent Mode:  [-] PRVC FiO2 (%):  [40 %] 40 % Set Rate:  [20 bmp-30 bmp] 20 bmp Vt Set:  [500 mL] 500 mL PEEP:  [5 cmH20] 5 cmH20 Plateau Pressure:  [13 cmH20-26 cmH20] 15 cmH20 INTAKE / OUTPUT:  Intake/Output Summary (Last 24 hours) at 01/25/15 0903 Last data filed at 01/25/15 0800  Gross per 24 hour  Intake 2057.97 ml  Output    970 ml  Net 1087.97 ml    PHYSICAL EXAMINATION: General:  Chronically ill appearing male in NAD Neuro: sedated , RASS -3 HEENT:  Trach clean Cardiovascular:  s1 s2 RIR Lungs:  Coarse, decreased BL Abdomen:  Soft, non-tender, mild distension  Musculoskeletal:  No acute deformity Skin:   Sacral wound  LABS:  CBC  Recent Labs Lab 01/23/15 2120 01/24/15 0521 01/25/15 0400  WBC 10.4 11.1* 7.3  HGB 9.4* 9.5* 8.6*  HCT 31.6* 31.3* 28.5*  PLT 320 356 321   Coag's  Recent Labs Lab 01/23/15 0227 01/24/15 0521 01/25/15 0400  INR 3.54* 2.82* 2.65*   BMET  Recent Labs Lab 01/23/15 2120 01/24/15 0521 01/25/15 0400  NA 137 144 143  K 3.9 4.2 3.4*  CL 106 112* 109  CO2 23 26 24   BUN 25* 30* 31*  CREATININE 0.43* 0.42* 0.45*  GLUCOSE 141* 94 85   Electrolytes  Recent Labs Lab 01/22/15 1345 01/23/15 0215  01/23/15 2120 01/24/15 0521 01/25/15 0400  CALCIUM  --   --   < > 8.6* 9.0 8.7*  MG 2.8* 2.2  --   --  1.9  --   PHOS 3.5 2.3*  --   --  4.1  --   < > = values in this interval not displayed. Sepsis Markers  Recent Labs Lab 01/21/15 0157 01/21/15 0215  LATICACIDVEN  --  0.66  PROCALCITON <0.10  --    ABG  Recent Labs Lab 01/21/15 0205 01/21/15 0602 01/23/15 2219  PHART 7.398 7.408 7.379  PCO2ART 45.7* 36.9 36.6  PO2ART 197.0* 98.0 130.0*   Liver Enzymes  Recent Labs Lab 01/21/15 0157  AST 18  ALT 12*  ALKPHOS 74  BILITOT 0.6  ALBUMIN 2.7*  Cardiac Enzymes  Recent Labs Lab 01/21/15 0157  TROPONINI <0.03   Glucose  Recent Labs Lab 01/24/15 1149 01/24/15 1558 01/24/15 2017 01/25/15 0003 01/25/15 0434 01/25/15 0825  GLUCAP 81 76 80 101* 77 86    Imaging Dg Chest Port 1 View  01/25/2015   CLINICAL DATA:  Acute respiratory failure.  EXAM: PORTABLE CHEST - 1 VIEW  COMPARISON:  01/24/2015.  FINDINGS: Right PICC line in stable position. Tracheostomy tube in stable position. Cardiomegaly with normal pulmonary vascularity. Low lung volumes with progressive bibasilar atelectasis and/or infiltrates. Small left pleural effusion cannot be excluded. No pneumothorax.  IMPRESSION: 1. Lines and tubes in stable position. 2. Low lung volumes with progressive bibasilar atelectasis and/or infiltrates. 3. Stable cardiomegaly.    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 HCAP Tracheal obstruction -intermittent  P:   Cont vent support. PRVC 40% PEEP 5, RR 20, TV 500 Trach changed to to Cisco yesterday Still has lots of seceretions May be transferred to [facility] tomorrow.    CARDIOVASCULAR CVL R subclavian from OSH >>>out  RUA PICC 8/4>>> A:  Atrial fibrillation with RVR > improved with volume Hypotension, severe sepsis vs hypovolemia - improved, 8/6  H/o HTN P:  Telemetry monitoring Off neo yesterday.  RENAL A:   Hpomag Hpophos P:     Replete lytes as needed  GASTROINTESTINAL A:   Protein calorie malnutrition P:   Tube feeds per dietary, PEG Protonix for SUP   HEMATOLOGIC A:   Anemia chronic Coumadin for AF P:  Coumadin per pharmacy - supratherapeutic 8/6   INFECTIOUS A:  Septic shock  most likely UTI. Pseudomonas HCAP, Line changed  Entrococcus, pseudomonas UTI Recent klebsiella PNA (amp resistant only) Recent Proteus UTI (macrobid resistant only)   P:   BCx2 8/4 >>> UC 8/4 >>> enterococcus, pseudomonas Sputum 8/4 >>> Pseudomonas Vancomycin 8/4>>> 8/7 Zosyn 8/4>>>  Cont broad spectrum abx as above. Continue for 10 days (till 8/14)  Will be able to narrow abx on discharge as the organisms are sensitive.    ENDOCRINE A:   Hyperglycemia without history of DM  P:   CBgs Add SSI if consistently greater than 180   NEUROLOGIC A:   Encephalopathy- improved  Quadriplegia Bipolar disorder Vent dyssynchrony  P:   RASS goal: 0 Continue home depakote (levels per pharm) PRN fentanyl    FAMILY  - Updates: pt updated 8/6.  No family available today. - Inter-disciplinary family meet or Palliative Care meeting due by:  8/11   The patient is critically ill with multiple organ  systems failure and requires high complexity decision making for assessment and support, frequent evaluation and titration of therapies, application of advanced monitoring technologies and extensive interpretation of multiple databases. Critical Care Time devoted to patient care services described in this note independent of APP time is 40 minutes.   Chilton Greathouse MD [facility] Pager [phone] If no answer or after 3pm call: [phone] 01/25/2015, 9:17 AM

## 2015-01-26 LAB — GLUCOSE, CAPILLARY
GLUCOSE-CAPILLARY: 76 mg/dL (ref 65–99)
GLUCOSE-CAPILLARY: 89 mg/dL (ref 65–99)
Glucose-Capillary: 81 mg/dL (ref 65–99)
Glucose-Capillary: 87 mg/dL (ref 65–99)
Glucose-Capillary: 84 mg/dL (ref 65–99)
Glucose-Capillary: 89 mg/dL (ref 65–99)

## 2015-01-26 LAB — CULTURE, BLOOD (ROUTINE X 2)
CULTURE: NO GROWTH
Culture: NO GROWTH

## 2015-01-26 LAB — PROTIME-INR
INR: 2.18 — AB (ref 0.00–1.49)
Prothrombin Time: 24.1 seconds — ABNORMAL HIGH (ref 11.6–15.2)

## 2015-01-26 MED ORDER — VALPROIC ACID 250 MG/5ML PO SYRP: 500.0000 mg | ORAL_SOLUTION | Freq: Two times a day (BID) | ORAL | Status: AC

## 2015-01-26 MED ORDER — PRO-STAT SUGAR FREE PO LIQD: 30.0000 mL | Freq: Three times a day (TID) | ORAL | Status: AC

## 2015-01-26 MED ORDER — WARFARIN SODIUM 2 MG PO TABS
2.0000 mg | ORAL_TABLET | Freq: Once | ORAL | Status: AC
  Administered 2015-01-26: 2 mg via ORAL
  Filled 2015-01-26: qty 1

## 2015-01-26 MED ORDER — PIPERACILLIN-TAZOBACTAM 3.375 G IVPB: 3.3750 g | Freq: Three times a day (TID) | INTRAVENOUS | Status: AC

## 2015-01-26 MED ORDER — ACETAMINOPHEN 160 MG/5ML PO SOLN: 650.0000 mg | Freq: Four times a day (QID) | ORAL | Status: AC | PRN

## 2015-01-26 MED ORDER — POTASSIUM CHLORIDE 10 MEQ/50ML IV SOLN
10.0000 meq | INTRAVENOUS | Status: AC
  Administered 2015-01-26 (×2): 10 meq via INTRAVENOUS
  Filled 2015-01-26 (×2): qty 50

## 2015-01-26 MED ORDER — VITAL AF 1.2 CAL PO LIQD: 1000.0000 mL | ORAL | Status: AC

## 2015-01-26 MED ORDER — CARVEDILOL 3.125 MG PO TABS: 3.1250 mg | ORAL_TABLET | Freq: Two times a day (BID) | ORAL | Status: AC

## 2015-01-26 NOTE — Progress Notes (Signed)
Patient refusing treatment, explained to patient the importance and educated patient of risks.  Patient pushing sound past trach and stating no when asked if he wants to get better.  Educated patient of the danger and possible damage he is doing to his throat, patient states lack of care. Consulted Chaplin to come and speak with patient, patient seems distant and dismissive of care.

## 2015-01-26 NOTE — Progress Notes (Signed)
Patient ID: Chase Bright, male   DOB: 1940/11/13, 74 y.o.   MRN: [id number]  Was called by ICU nurse this afternoon who reported that the patient was making a normal voice in spite of the tracheostomy being in place with cuff inflated.  He found that he had to add more and more air into the cuff. Upon my examination, the tracheostomy flange was found to be displaced anteriorly compared to the neck skin.  The trach was pushed into position and the strap was tightened.  At that point, he was no longer able to make a voice and the cuff held pressure. A/P: Tracheostomy dependence, tracheostomy displacement The tracheostomy was repositioned without difficulty.  The strap needs to be kept tight enough to prevent anterior displacement of the tube.  If this is not a stable arrangement over time, exchanging the trach tube for a proximal extended length tube could be considered.

## 2015-01-26 NOTE — Progress Notes (Signed)
Telephonic consent to transfer patient given by Pts daughter [name].   Witnessed by [employer].

## 2015-01-26 NOTE — Progress Notes (Signed)
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 In ED was tachycardic (AF RVR), hypotensive, and febrile with findings concerning for HCAP. PCCM to admit.   SIGNIFICANT EVENTS: 8/4 admit 8/4- severe dest, emergent bronch - mucous RML, granulation obstruction posterior wall 8/6  increased airway pressures to 60. PB investigated at bedside. Initially, he could not pass suction catheter. However, was able to pass it by moving patient's neck around. Trach stoma felt to be obstructed by posterior wall of trachea  SUBJECTIVE:  Much better today in terms of peak pressures and seceretions.  VITAL SIGNS: Temp:  [99 F (37.2 C)-99.5 F (37.5 C)] 99.1 F (37.3 C) (08/09 1000) Pulse Rate:  [30-118] 79 (08/09 1000) Resp:  [15-27] 21 (08/09 1000) BP: (79-155)/(49-91) 139/78 mmHg (08/09 1000) SpO2:  [99 %-100 %] 100 % (08/09 1000) FiO2 (%):  [40 %] 40 % (08/09 0818) Weight:  [182 lb 5.1 oz (82.7 kg)] 182 lb 5.1 oz (82.7 kg) (08/09 0346) HEMODYNAMICS:   VENTILATOR SETTINGS: Vent Mode:  [-] PRVC FiO2 (%):  [40 %] 40 % Set Rate:  [20 bmp] 20 bmp Vt Set:  [500 mL] 500 mL PEEP:  [5 cmH20] 5 cmH20 Plateau Pressure:  [13 cmH20-17 cmH20] 13 cmH20 INTAKE / OUTPUT:  Intake/Output Summary (Last 24 hours) at 01/26/15 1029 Last data filed at 01/26/15 1000  Gross per 24 hour  Intake 1662.08 ml  Output   1365 ml  Net 297.08 ml    PHYSICAL EXAMINATION: General:  Chronically ill appearing male in NAD Neuro: sedated , RASS -3 HEENT:  Trach clean Cardiovascular:  s1 s2 RIR Lungs:  Coarse, decreased BL Abdomen:  Soft, non-tender, mild distension  Musculoskeletal:  No acute deformity Skin:  Sacral  wound  LABS:  CBC  Recent Labs Lab 01/23/15 2120 01/24/15 0521 01/25/15 0400  WBC 10.4 11.1* 7.3  HGB 9.4* 9.5* 8.6*  HCT 31.6* 31.3* 28.5*  PLT 320 356 321   Coag's  Recent Labs Lab 01/24/15 0521 01/25/15 0400 01/26/15 0457  INR 2.82* 2.65* 2.18*   BMET  Recent Labs Lab 01/23/15 2120 01/24/15 0521 01/25/15 0400  NA 137 144 143  K 3.9 4.2 3.4*  CL 106 112* 109  CO2 23 26 24   BUN 25* 30* 31*  CREATININE 0.43* 0.42* 0.45*  GLUCOSE 141* 94 85   Electrolytes  Recent Labs Lab 01/22/15 1345 01/23/15 0215  01/23/15 2120 01/24/15 0521 01/25/15 0400  CALCIUM  --   --   < > 8.6* 9.0 8.7*  MG 2.8* 2.2  --   --  1.9  --   PHOS 3.5 2.3*  --   --  4.1  --   < > = values in this interval not displayed. Sepsis Markers  Recent Labs Lab 01/21/15 0157 01/21/15 0215  LATICACIDVEN  --  0.66  PROCALCITON <0.10  --    ABG  Recent Labs Lab 01/21/15 0205 01/21/15 0602 01/23/15 2219  PHART 7.398 7.408 7.379  PCO2ART 45.7* 36.9 36.6  PO2ART 197.0* 98.0 130.0*   Liver Enzymes  Recent Labs Lab 01/21/15 0157  AST 18  ALT 12*  ALKPHOS 74  BILITOT 0.6  ALBUMIN 2.7*   Cardiac Enzymes  Recent Labs Lab 01/21/15  0157  TROPONINI <0.03   Glucose  Recent Labs Lab 01/25/15 1157 01/25/15 1528 01/25/15 1955 01/25/15 2335 01/26/15 0425 01/26/15 0740  GLUCAP 82 83 91 76 81 87    Imaging No results found.   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 HCAP Tracheal obstruction -intermittent  P:   Cont vent support. PRVC 40% PEEP 5, RR 20, TV 500 Trach changed to to Cisco Still has lots of seceretions Will be transferred to [facility] today.   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  RENAL A:   Hpomag Hpophos P:     Replete lytes as needed  GASTROINTESTINAL A:   Protein calorie malnutrition P:   Tube feeds per dietary, PEG Protonix for SUP   HEMATOLOGIC A:   Anemia chronic Coumadin for AF P:  Coumadin per pharmacy - supratherapeutic 8/6   INFECTIOUS A:  Septic shock  most likely UTI. Pseudomonas HCAP, Line changed  Entrococcus, pseudomonas UTI Recent klebsiella PNA (amp resistant only) Recent Proteus UTI (macrobid resistant only)   P:   BCx2 8/4 >>> UC 8/4 >>> enterococcus, pseudomonas Sputum 8/4 >>> Pseudomonas Vancomycin 8/4>>> 8/7 Zosyn 8/4>>>  Cont broad spectrum abx as above. Continue for 10 days (till 8/14)  Will be able to narrow abx on discharge as the organisms are sensitive.    ENDOCRINE A:   Hyperglycemia without history of DM  P:   CBgs Add SSI if consistently greater than 180   NEUROLOGIC A:   Encephalopathy- improved  Quadriplegia Bipolar disorder Vent dyssynchrony  P:   RASS goal: 0 Continue home depakote (levels per pharm) PRN fentanyl    FAMILY  - Updates: pt updated 8/6.  No family available today. - Inter-disciplinary family meet or Palliative Care meeting due by:  8/11   The patient is critically ill with multiple organ systems failure and requires high complexity decision making for assessment and support, frequent evaluation and titration of therapies, application of advanced monitoring technologies and extensive interpretation of multiple databases. Critical Care Time devoted to patient care services described in this note independent of APP time is 40 minutes.   Chilton Greathouse MD [facility] Pager [phone] If no answer or after 3pm call: [phone] 01/26/2015, 10:29 AM

## 2015-01-26 NOTE — Progress Notes (Signed)
[facility] ADULT ICU REPLACEMENT PROTOCOL FOR AM LAB REPLACEMENT ONLY  The patient does apply for the [facility] Adult ICU Electrolyte Replacment Protocol based on the criteria listed below:   1. Is GFR >/= 40 ml/min? Yes.    Patient's GFR today is >60 2. Is urine output >/= 0.5 ml/kg/hr for the last 6 hours? Yes.   Patient's UOP is 0.81 ml/kg/hr 3. Is BUN < 60 mg/dL? Yes.    Patient's BUN today is 31 4. Abnormal electrolyte(s): Potassium 3.4 5. Ordered repletion with: Potassium per Protocol    Clemmie Marxen P 01/26/2015 6:13 AM

## 2015-01-26 NOTE — Progress Notes (Signed)
ANTICOAGULATION CONSULT NOTE - Follow Up Consult  Pharmacy Consult for Coumadin  Indication: AFib  Allergies  Allergen Reactions  . Morphine And Related     Unknown     Patient Measurements: Height:  (172.7 cm) Weight: 182 lb 5.1 oz (82.7 kg) IBW/kg (Calculated) : 68.4  Vital Signs: Temp: 99.2 F (37.3 C) (08/09 1300) BP: 95/57 mmHg (08/09 1300) Pulse Rate: Chase (08/09 1300)  Labs:  Recent Labs  01/23/15 2120 01/24/15 0521 01/25/15 0400 01/26/15 0457  HGB 9.4* 9.5* 8.6*  --   HCT 31.6* 31.3* 28.5*  --   PLT 320 356 321  --   LABPROT  --  29.2* 27.9* 24.1*  INR  --  2.82* 2.65* 2.18*  CREATININE 0.43* 0.42* 0.45*  --     Estimated Creatinine Clearance: 86.2 mL/min (by C-G formula based on Cr of 0.45).  Assessment: Chase Bright admitted with respiratory distress on Coumadin prior to admission for Afib.  Hgb 8.6, Plts 321- stable. Has had some pink tinged sputum, but no overt signs of bleeding per pt chart.  INR trend: 1.93 > 2.73 > 3.54 (dose held) > 2.82 > 2.65 > 2.18  PTA Coumadin dose:  daily per med history.  Goal of Therapy:  INR 2-3 Monitor platelets by anticoagulation protocol: Yes   Plan:  -warfarin 2 mg po x1 at 1700 (pt being transferred to [facility]) -Monitor daily PT/INR -Monitor for s/sx of bleeding  [place] Jansky, PharmD Clinical Pharmacy Resident Pager: [phone] 01/26/2015 1:47 PM

## 2015-01-26 NOTE — Care Management Important Message (Signed)
Important Message  Patient Details  Name: Andrus Sharp MRN: [id number] Date of Birth: April 22, 1941   Medicare Important Message Given:  Yes-second notification given    Vangie Bicker, RN 01/26/2015, 1:25 PMImportant Message  Patient Details  Name: Kerrick Miler MRN: [id number] Date of Birth: 03/15/1941   Medicare Important Message Given:  Yes-second notification given    Vangie Bicker, RN 01/26/2015, 1:25 PM

## 2015-01-26 NOTE — Progress Notes (Signed)
Patient ID: Chase Bright, male   DOB: [birth year], 74 y.o.   MRN: [id number] Voice returned with any anterior movement of the standard #8 cuffed Shiley trach tube.  The tube was changed to a #8 cuffed Proximal extended length tube without difficulty.  The voice problem did not occur and the tube seems to sit in better position.  The cuff is inflated and the ventilator working well.

## 2015-01-26 NOTE — Progress Notes (Signed)
ANTIBIOTIC CONSULT NOTE - Follow-Up  Pharmacy Consult for Zosyn  Indication: rule out sepsis  Allergies  Allergen Reactions  . Morphine And Related     Unknown    Patient Measurements: Height:  (172.7 cm) Weight: 182 lb 5.1 oz (82.7 kg) IBW/kg (Calculated) : 68.4  Vital Signs: Temp: 99.2 F (37.3 C) (08/09 1300) BP: 95/57 mmHg (08/09 1300) Pulse Rate: 77 (08/09 1300)  Labs:  Recent Labs  01/23/15 2120 01/24/15 0521 01/25/15 0400  WBC 10.4 11.1* 7.3  HGB 9.4* 9.5* 8.6*  PLT 320 356 321  CREATININE 0.43* 0.42* 0.45*   Estimated Creatinine Clearance: 86.2 mL/min (by C-G formula based on Cr of 0.45).  Assessment: Recent admission in June 2016 for pan-sensitive Klebsiella PNA and proteus UTI. Now back with respiratory distress. On day #5 of Zosyn for sepsis rule-out. Vancomycin was discontinued on 8/7.  Currently afebrile. WBC improved but still slightly elevated at 7.3.  No real BP trend. Mostly WNL, but hypertensive/hypotensive at times. Most recently 95/57  SCr 0.45, however patient is a quadriplegic making it difficult to determine an accurate CrCl.  8/4 Vanc>>8/7  8/4 Zosyn>>   8/4 BCx2: NGTD  8/4 UCx x2: 70K pseudomonas (pan sens), >100K enterococcus (R to LVQ)  8/4 Trach asp: few pseudomonas (Zosyn susceptible)  8/4 bronchial washings: few pseudomonas  Plan:  -Continue Zosyn 3.375G IV q8h to be infused over 4 hours -Trend WBC, temp, renal function -F/U results of blood cultures, duration of treatment  [place] Jansky, PharmD Clinical Pharmacy Resident Pager: [phone] 01/26/2015 1:52 PM

## 2015-01-26 NOTE — Care Management Note (Signed)
Case Management Note  Patient Details  Name: Chase Bright MRN: [id number] Date of Birth: 1940-08-16  Subjective/Objective:       Plan for transfer  To [facility] - ICU not needed per physician,  [facility] laison updated, daughter, Inetta Fermo notified, nurse updated.  All on board for transferring to [facility].              Action/Plan:   Expected Discharge Date:                  Expected Discharge Plan:  Long Term Acute Care (LTAC)  In-House Referral:  Clinical Social Work  Discharge planning Services  CM Consult  Post Acute Care Choice:    Choice offered to:     DME Arranged:    DME Agency:     HH Arranged:    HH Agency:     Status of Service:  Completed, signed off  Medicare Important Message Given:    Date Medicare IM Given:    Medicare IM give by:    Date Additional Medicare IM Given:    Additional Medicare Important Message give by:     If discussed at Long Length of Stay Meetings, dates discussed:    Additional Comments:  Vangie Bicker, RN 01/26/2015, 10:54 AM

## 2015-01-26 NOTE — Progress Notes (Signed)
Telephonic consent obtained to transfer patient to [facility] hospital fm Patients daughter [name].  Witnessed by Windy Fast RN

## 2015-01-26 NOTE — Progress Notes (Signed)
Chaplain responded to page from RN.  Pt able to speak despite being on vent.  Pt specifically said that he wishes to get better. Pt would speak then go through moments of pause while looking at you, chaplain would restate and pt would eventually respond.  Chaplain provided emotional support at bedside through empathetic listening and presence.  Chaplain available to follow up as needed.    01/26/15 1500  Clinical Encounter Type  Visited With Patient;Health care provider  Visit Type Initial;Psychological support;Critical Care  Referral From Nurse  Spiritual Encounters  Spiritual Needs Emotional  Stress Factors  Patient Stress Factors Health changes   Chase Bright 01/26/2015 3:58 PM

## 2017-02-04 IMAGING — CT CT ABD-PELV W/O CM
2 of 4 series · 4 of 46 positions shown, 5 images · non-contrast
Comparison: None.

CLINICAL DATA: Small bowel obstruction.

EXAM:
CT ABDOMEN AND PELVIS WITHOUT CONTRAST
TECHNIQUE: Multidetector CT imaging of the abdomen and pelvis was performed
following the standard protocol without IV contrast.

[Series 204: coronal · coronal · 0.45mm/px · 3 of 120 slices shown, 4 images]
[im 27/120  soft-tissue]
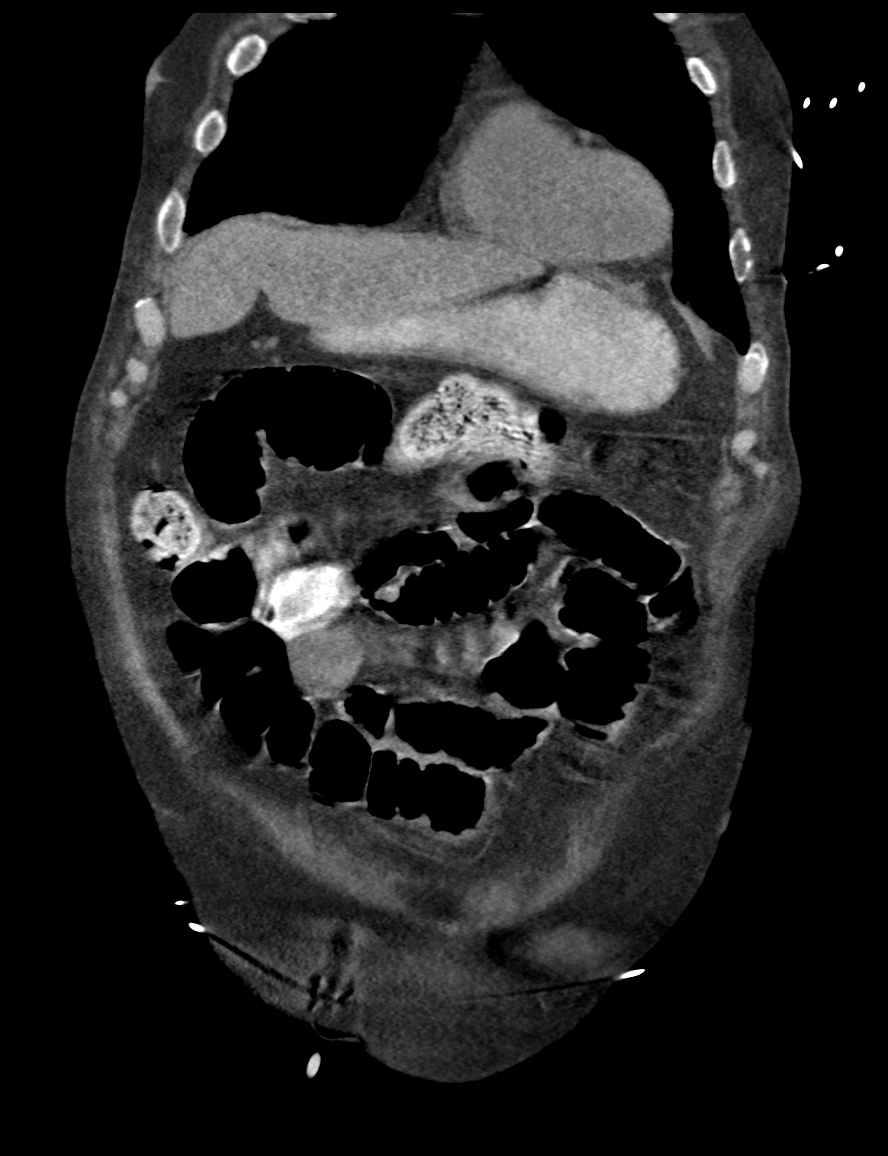
[im 27/120  bone]
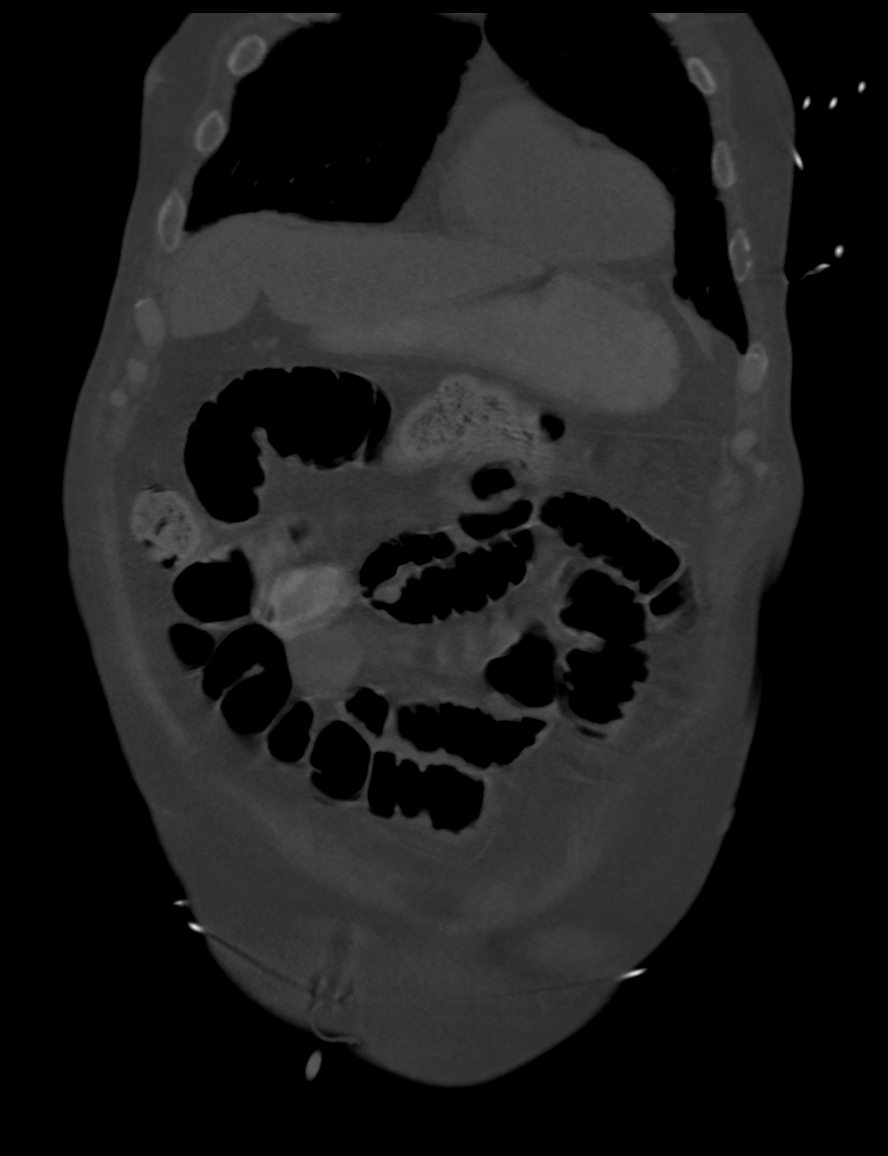
[im 67/120  soft-tissue]
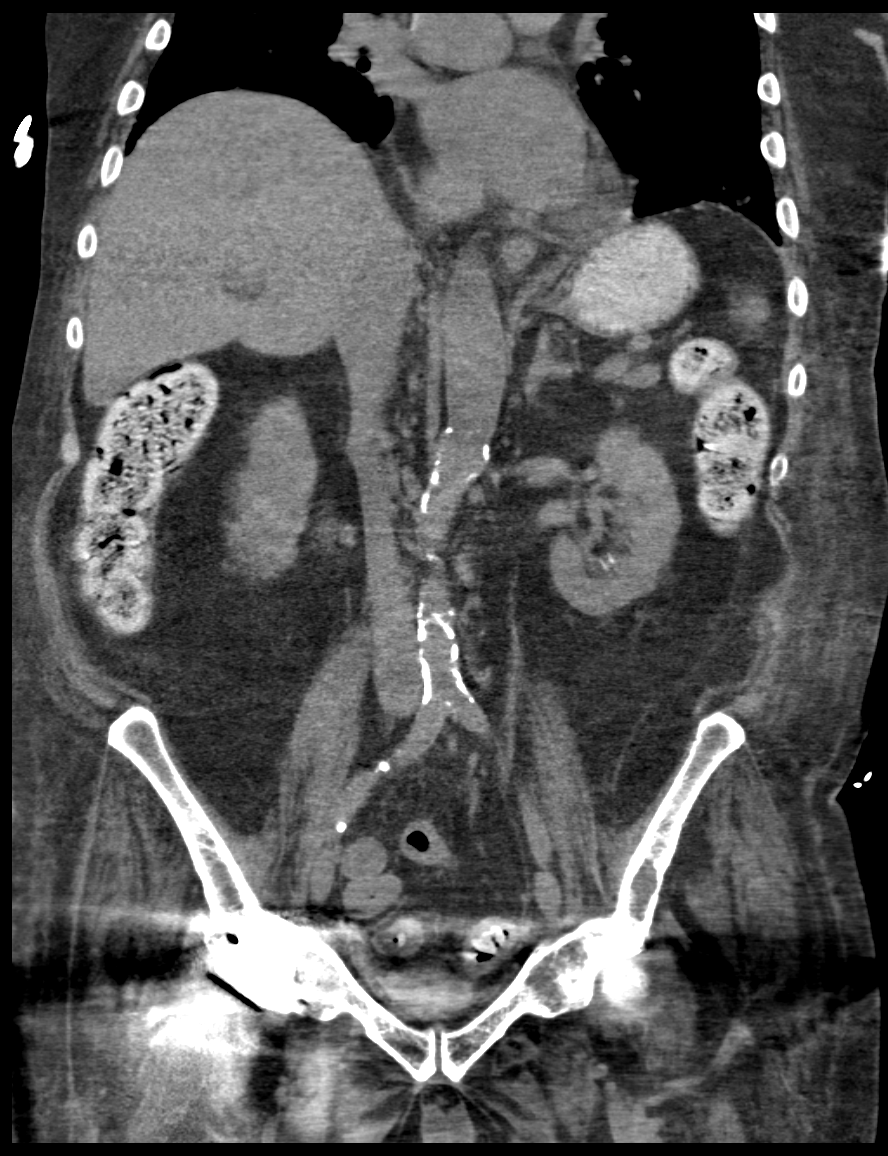
[im 93/120  soft-tissue]
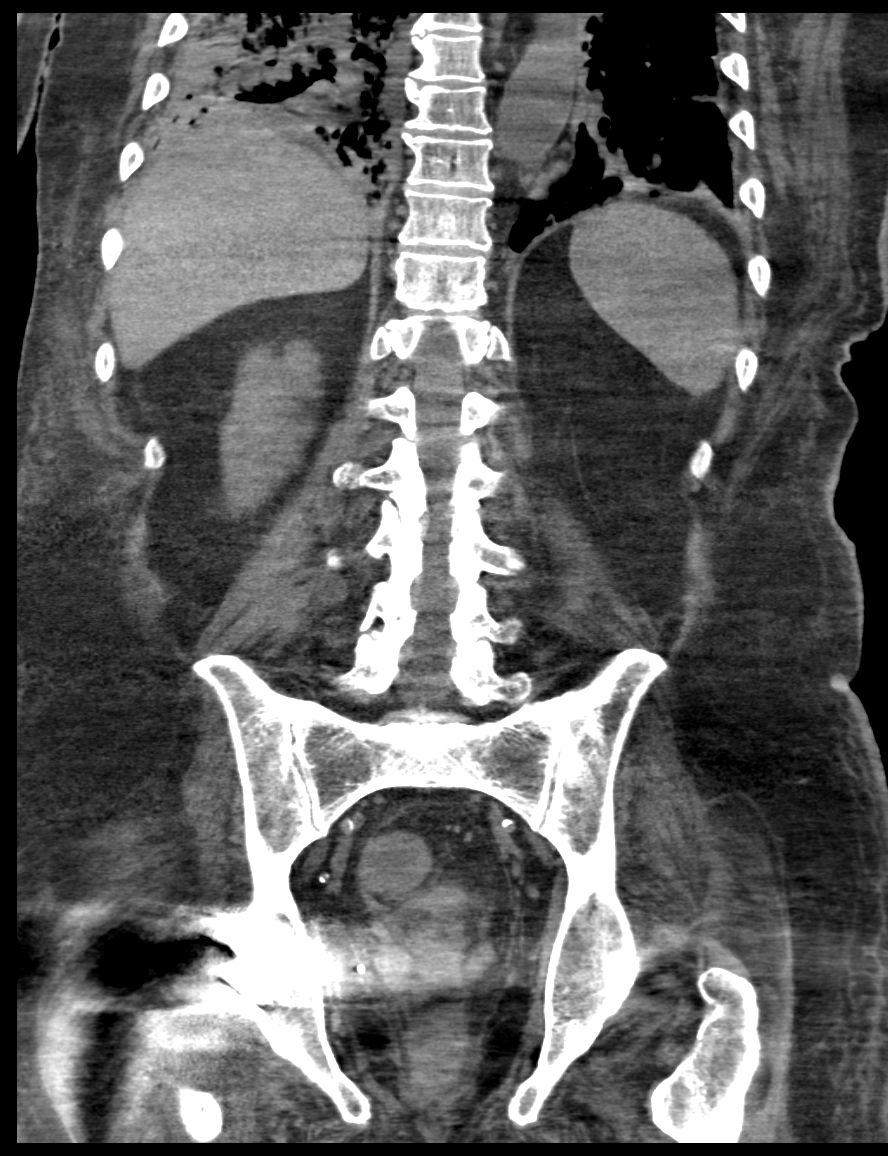

[Series 205: sagittal · sagittal · 0.45mm/px · 1 of 177 slices shown]
[im 59/177  soft-tissue]
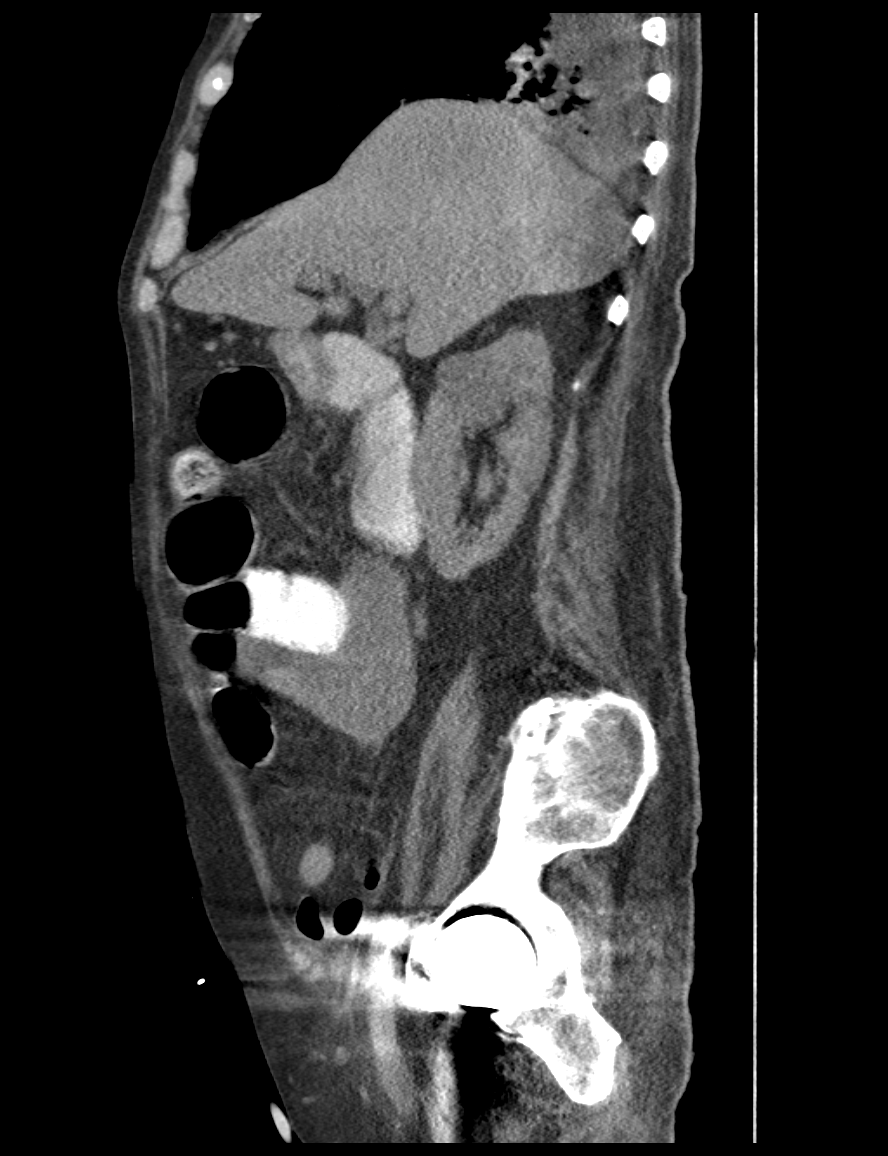

[4 of 46 positions shown; findings below may reference images not displayed]

FINDINGS: There is airspace consolidation in both lower lobes right greater
than left.

Lower chest:

Hepatobiliary: No focal liver abnormality identified. The lateral
segment of left lobe and caudate lobe of liver appears slightly
enlarged with respect to the right hepatic lobe. The contour the
liver is slightly irregular. Gallbladder normal. No biliary
dilatation.

Pancreas: Normal appearance of the pancreas.

Spleen: The spleen is unremarkable.

Adrenals/Urinary Tract: The adrenal glands are both normal.
Bilateral renal cysts are noted. These are incompletely
characterized without IV contrast. Bilateral renal calculi noted.
The largest is in the inferior pole of right kidney measuring 7 mm,
image 49 of series 201. Urinary bladder is obscured by beam
hardening artifact from right hip arthroplasty device.

Stomach/Bowel: The patient has a gastrostomy tube. Increase caliber
of the small bowel loops measuring up to 3.6 cm. There is enteric
contrast material within the small bowel loops and within the colon
up to the level of the sigmoid colon. The colon has a normal
caliber.

Vascular/Lymphatic: Calcified atherosclerotic disease involves the
abdominal aorta. No aneurysm. No enlarged retroperitoneal or
mesenteric adenopathy. No enlarged pelvic or inguinal lymph nodes.

Reproductive: Prostate gland appears enlarged.

Other: No free air. There is no significant free fluid or abnormal
fluid collections.

Musculoskeletal: Previous right hip arthroplasty. There is
degenerative disc disease noted within the lumbar spine. This is
most advanced at the L5-S1 level.
IMPRESSION: 1. Increase caliber of the small bowel loops compatible with either
partial small bowel obstruction or small bowel ileus. The enteric
contrast material has passed through the small-bowel and into the
distal colon.
2. Aortic atherosclerosis
3. Prostate gland enlargement
4. Morphologic features of the liver suggestive of early cirrhosis.
5. Nephrolithiasis.

## 2017-04-08 IMAGING — CR DG CHEST 1V PORT
1 series · 1 of 1 positions shown · non-contrast
Comparison: 11/21/2014

CLINICAL DATA: Dyspnea.

EXAM:
PORTABLE CHEST - 1 VIEW

[AP]
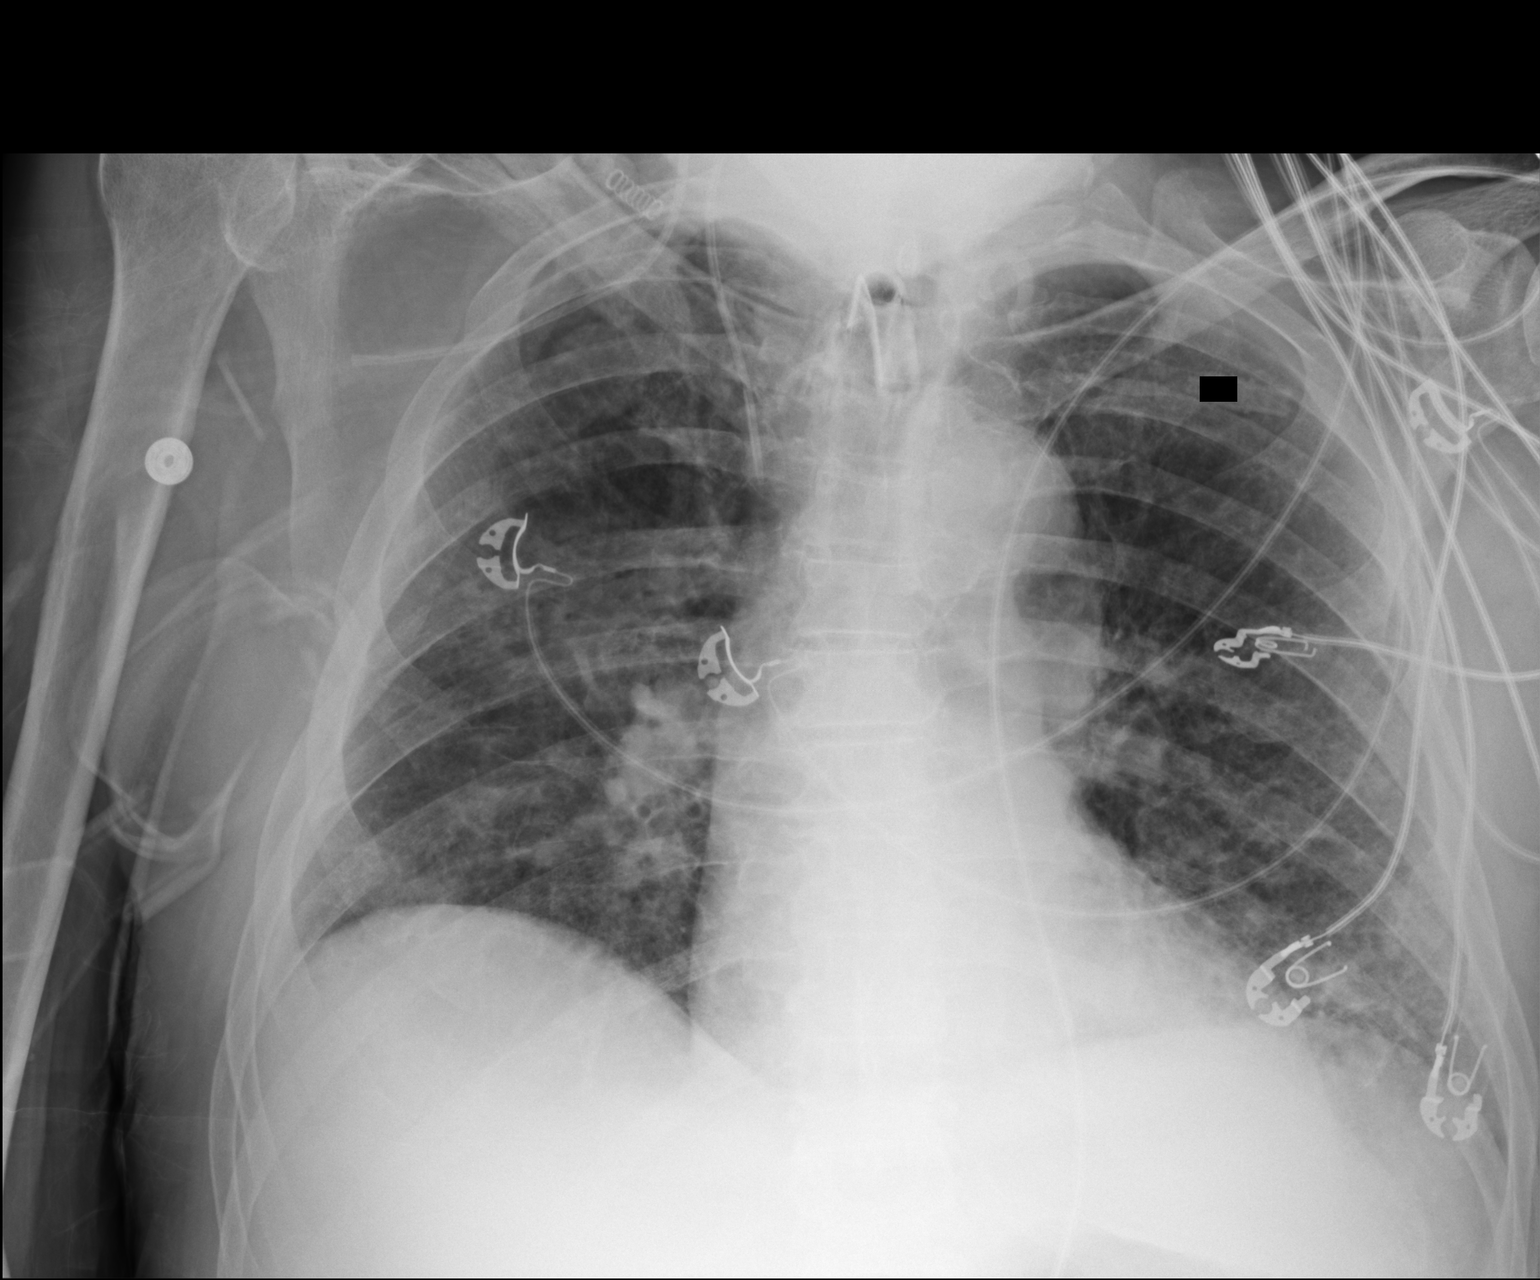

[1 of 1 positions shown; findings below may reference images not displayed]

FINDINGS: There is a tracheostomy. There is a right jugular central line with
tip in the expected location of the SVC just above the azygos vein
junction. There is no pneumothorax. There is patchy airspace opacity
in the left base which could be infectious or atelectatic. There is
no large effusion.
IMPRESSION: Patchy left base opacity, likely infectious or atelectatic.

## 2017-04-10 IMAGING — CR DG CHEST 1V PORT
1 series · 1 of 1 positions shown · non-contrast
Comparison: Chest radiograph performed 01/22/2015

CLINICAL DATA: Acute onset of respiratory failure. Thick
secretions. Initial encounter.

EXAM:
PORTABLE CHEST - 1 VIEW

[AP]
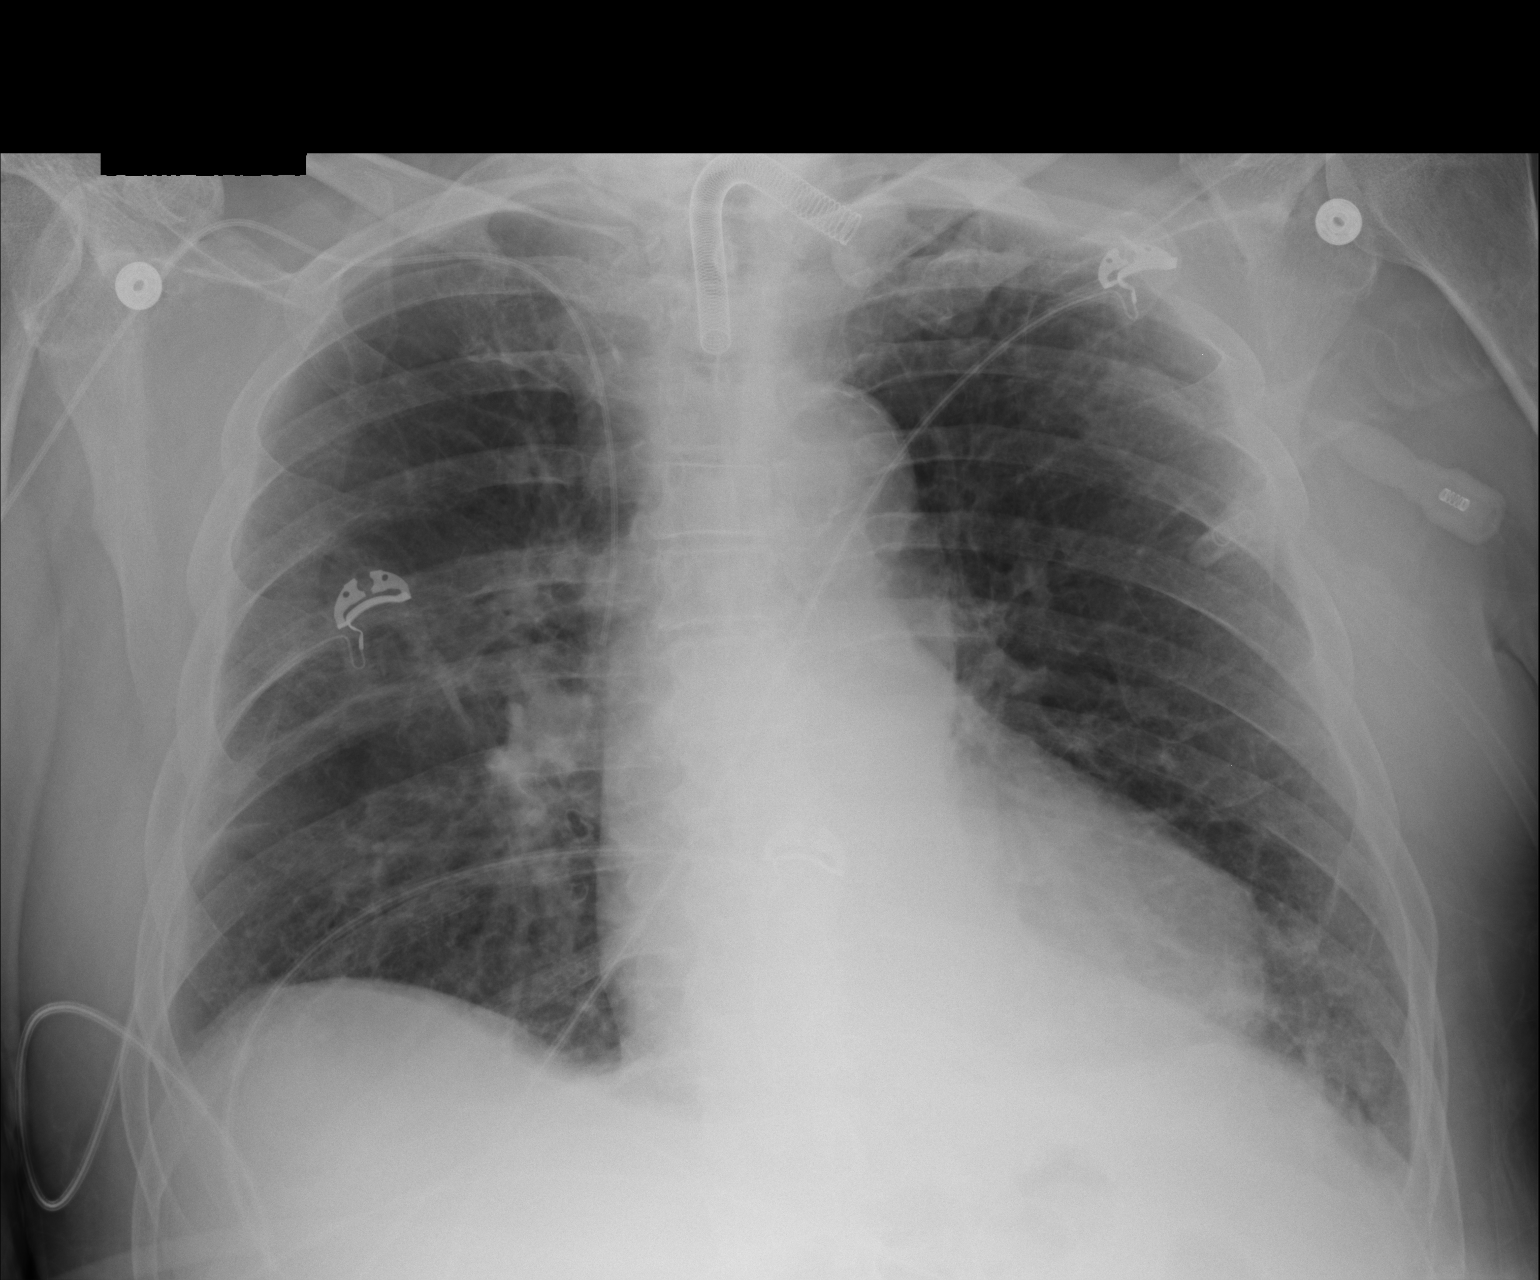

[1 of 1 positions shown; findings below may reference images not displayed]

FINDINGS: The patient's tracheostomy tube is seen ending 6-7 cm above the
carina. A right PICC is noted ending about the mid to distal SVC.

Mild right midlung and left basilar airspace opacities likely
reflect mild pneumonia. No pleural effusion or pneumothorax is seen.

The cardiomediastinal silhouette is borderline normal in size. No
acute osseous abnormalities are identified.
IMPRESSION: Mild right mid lung and left basilar airspace opacities likely
reflect mild pneumonia, given the patient's symptoms.

## 2017-08-17 DEATH — deceased
